# Patient Record
Sex: Male | Born: 1975 | Race: White | Hispanic: No | Marital: Married | Smoking: Former smoker
Health system: Southern US, Community
[De-identification: ages and names within clinical notes are randomized; demographics above are authoritative.]

## PROBLEM LIST (undated history)

## (undated) DIAGNOSIS — G35 Multiple sclerosis: Secondary | ICD-10-CM

## (undated) DIAGNOSIS — Z87891 Personal history of nicotine dependence: Secondary | ICD-10-CM

## (undated) DIAGNOSIS — G2581 Restless legs syndrome: Secondary | ICD-10-CM

## (undated) DIAGNOSIS — G43909 Migraine, unspecified, not intractable, without status migrainosus: Secondary | ICD-10-CM

## (undated) DIAGNOSIS — E291 Testicular hypofunction: Secondary | ICD-10-CM

## (undated) DIAGNOSIS — Z9109 Other allergy status, other than to drugs and biological substances: Secondary | ICD-10-CM

## (undated) DIAGNOSIS — G4733 Obstructive sleep apnea (adult) (pediatric): Secondary | ICD-10-CM

## (undated) DIAGNOSIS — M545 Low back pain: Secondary | ICD-10-CM

## (undated) DIAGNOSIS — Z1211 Encounter for screening for malignant neoplasm of colon: Secondary | ICD-10-CM

## (undated) DIAGNOSIS — Z9989 Dependence on other enabling machines and devices: Secondary | ICD-10-CM

## (undated) DIAGNOSIS — G44009 Cluster headache syndrome, unspecified, not intractable: Secondary | ICD-10-CM

## (undated) HISTORY — DX: Dependence on other enabling machines and devices: Z99.89

## (undated) HISTORY — DX: Personal history of nicotine dependence: Z87.891

## (undated) HISTORY — DX: Multiple sclerosis: G35

## (undated) HISTORY — DX: Migraine, unspecified, not intractable, without status migrainosus: G43.909

## (undated) HISTORY — DX: Obstructive sleep apnea (adult) (pediatric): G47.33

## (undated) HISTORY — DX: Cluster headache syndrome, unspecified, not intractable: G44.009

## (undated) HISTORY — DX: Encounter for screening for malignant neoplasm of colon: Z12.11

## (undated) HISTORY — DX: Low back pain: M54.5

## (undated) HISTORY — DX: Testicular hypofunction: E29.1

## (undated) HISTORY — DX: Restless legs syndrome: G25.81

---

## 2009-08-15 ENCOUNTER — Ambulatory Visit: Payer: Self-pay | Admitting: Internal Medicine

## 2009-08-15 DIAGNOSIS — M545 Low back pain, unspecified: Secondary | ICD-10-CM | POA: Insufficient documentation

## 2009-08-15 DIAGNOSIS — Z87891 Personal history of nicotine dependence: Secondary | ICD-10-CM | POA: Insufficient documentation

## 2009-08-15 HISTORY — DX: Low back pain, unspecified: M54.50

## 2009-08-15 HISTORY — DX: Personal history of nicotine dependence: Z87.891

## 2009-08-16 LAB — CONVERTED CEMR LAB
Albumin: 4.3 g/dL (ref 3.5–5.2)
Alkaline Phosphatase: 57 units/L (ref 39–117)
Basophils Relative: 0.8 % (ref 0.0–3.0)
CO2: 32 meq/L (ref 19–32)
Calcium: 9.4 mg/dL (ref 8.4–10.5)
Chloride: 104 meq/L (ref 96–112)
Eosinophils Absolute: 0.1 10*3/uL (ref 0.0–0.7)
HCT: 48.1 % (ref 39.0–52.0)
HDL: 44.6 mg/dL (ref >39.00)
Hemoglobin: 16.6 g/dL (ref 13.0–17.0)
Leukocytes, UA: NEGATIVE
Lymphocytes Relative: 25.6 % (ref 12.0–46.0)
Lymphs Abs: 1.7 10*3/uL (ref 0.7–4.0)
MCHC: 34.6 g/dL (ref 30.0–36.0)
MCV: 90.8 fL (ref 78.0–100.0)
Neutro Abs: 4 10*3/uL (ref 1.4–7.7)
Nitrite: NEGATIVE
Potassium: 4.4 meq/L (ref 3.5–5.1)
RBC: 5.3 M/uL (ref 4.22–5.81)
Sodium: 136 meq/L (ref 135–145)
Specific Gravity, Urine: 1.025 (ref 1.000–1.030)
TSH: 0.49 microintl units/mL (ref 0.35–5.50)
Total Protein: 7.4 g/dL (ref 6.0–8.3)
pH: 5.5 (ref 5.0–8.0)

## 2009-12-30 ENCOUNTER — Ambulatory Visit: Payer: Self-pay | Admitting: Internal Medicine

## 2009-12-30 DIAGNOSIS — J019 Acute sinusitis, unspecified: Secondary | ICD-10-CM | POA: Insufficient documentation

## 2010-01-20 ENCOUNTER — Telehealth: Payer: Self-pay | Admitting: Internal Medicine

## 2010-01-24 ENCOUNTER — Ambulatory Visit: Payer: Self-pay | Admitting: Internal Medicine

## 2010-02-27 ENCOUNTER — Ambulatory Visit: Payer: Self-pay | Admitting: Internal Medicine

## 2010-02-27 DIAGNOSIS — R519 Headache, unspecified: Secondary | ICD-10-CM | POA: Insufficient documentation

## 2010-02-27 DIAGNOSIS — R51 Headache: Secondary | ICD-10-CM | POA: Insufficient documentation

## 2010-02-27 DIAGNOSIS — G44009 Cluster headache syndrome, unspecified, not intractable: Secondary | ICD-10-CM

## 2010-02-27 HISTORY — DX: Cluster headache syndrome, unspecified, not intractable: G44.009

## 2010-10-21 ENCOUNTER — Ambulatory Visit: Payer: Self-pay | Admitting: Internal Medicine

## 2010-10-21 DIAGNOSIS — J069 Acute upper respiratory infection, unspecified: Secondary | ICD-10-CM | POA: Insufficient documentation

## 2010-12-09 NOTE — Assessment & Plan Note (Signed)
Summary: DR AVP PT--NOT IN CLINIC THIS PM  HEADACHES-FEVER-SDR---STC   Vital Signs:  Patient profile:   35 year old male Height:      74 inches Weight:      201.25 pounds BMI:     25.93 O2 Sat:      97 % on Room air Temp:     98.3 degrees F oral Pulse rate:   90 / minute BP sitting:   108 / 62  (left arm) Cuff size:   regular  Vitals Entered ByZella Ball Ewing (February 27, 2010 4:10 PM)  O2 Flow:  Room air CC: Headaches/RE   Primary Care Provider:  Tresa Garter MD  CC:  Headaches/RE.  History of Present Illness: pt of dr plotnikov - had recent sinus/uri infection that seemed to resolve, but here with 4 wks headaches that seem different  -  located to crown, sides and back of the head, and behind the eyes;  no fever , congestion this time except for allergies;  no ST, cough.  Pt denies CP, sob, doe, wheezing, orthopnea, pnd, worsening LE edema, palps, dizziness or syncope  Pt denies new neuro symptoms such as facial or extremity weakness , or blurred vision.  Tried tylenol last night - helped, but concerned b/c seems to be more freq the last  whole wk though still mild to mod and intermittent;  also tried etodolac this AM that might have helped some, but pain at least still mild to mod.  No dysequilibrium, Geisler or gait problem., trauma.  Although has not had significant depressive symtpoms, has had signfiicant stress and anxious -  works for [employer]  - [place]" [place] year in the spring as it is known there.  No scalp itch or lesions.  No prior hx of similar HA in the past, or other such as migraine, but has had ongoing teeth grinding as well, also worse in the past wk.    Problems Prior to Update: 1)  Headache  (ICD-784.0) 2)  Sinusitis- Acute-nos  (ICD-461.9) 3)  Physical Examination  (ICD-V70.0) 4)  Low Back Pain  (ICD-724.2) 5)  Tobacco Use, Quit  (ICD-V15.82)  Medications Prior to Update: 1)  Hydrocodone-Acetaminophen 5-325 Mg Tabs  (Hydrocodone-Acetaminophen) .Marland Kitchen.. 1 By Mouth Up To 4 Times Per Day As Needed For Pain 2)  Vitamin D3 1000 Unit  Tabs (Cholecalciferol) .Marland Kitchen.. 1 By Mouth Daily  Current Medications (verified): 1)  Nabumetone 750 Mg Tabs (Nabumetone) .Marland Kitchen.. 1 By Mouth Two Times A Day As Needed Pain 2)  Vitamin D3 1000 Unit  Tabs (Cholecalciferol) .Marland Kitchen.. 1 By Mouth Daily 3)  Imipramine Hcl 50 Mg Tabs (Imipramine Hcl) .Marland Kitchen.. 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   Head:  normocephalic and atraumatic.   Eyes:  vision grossly intact, pupils equal, and pupils round.   Ears:  R ear normal and L ear normal.   Nose:  no external deformity and no nasal discharge.  Mouth:  no gingival abnormalities and pharynx pink and moist.  , mult teeth with grinding down abnormalities Neck:  supple and no masses.   Lungs:  normal respiratory effort and normal breath sounds.   Heart:  normal rate and regular rhythm.   Msk:  head NCAT; no c-spine tender  Extremities:  no edema, no erythema  Neurologic:  alert & oriented X3, cranial nerves II-XII intact, strength normal in all extremities, sensation intact to light touch, gait normal, and DTRs symmetrical and normal.   Skin:  color normal and no scalp rashes.   Psych:  moderately anxious.     Impression & Recommendations:  Problem # 1:  HEADACHE (ICD-784.0)  His updated medication list for this problem includes:    Nabumetone  750 Mg Tabs (Nabumetone) .Marland Kitchen... 1 by mouth two times a day as needed pain c/w tension HA most likely ;  likely increased due to work stress like the teeth grinding as well;  for imipramine at bedtime, and relafen two times a day as needed ; f/y primary MD for any worsening symtpoms;  I do not feel he needs MRI at this time or neuro eval  Complete Medication List: 1)  Nabumetone 750 Mg Tabs (Nabumetone) .Marland Kitchen.. 1 by mouth two times a day as needed pain 2)  Vitamin D3 1000 Unit Tabs (Cholecalciferol) .Marland Kitchen.. 1 by mouth daily 3)  Imipramine Hcl 50 Mg Tabs (Imipramine hcl) .Marland Kitchen.. 1po at bedtime  Patient Instructions: 1)  Please take all new medications as prescribed 2)  Continue all previous medications as before this visit  3)  Please schedule an appointment with your primary doctor for any worsening symptoms  Prescriptions: IMIPRAMINE HCL 50 MG TABS (IMIPRAMINE HCL) 1po at bedtime  #30 x 5   Entered and Authorized by:   Corwin Levins MD   Signed by:   Corwin Levins MD on 02/27/2010   Method used:   Print then Give to Patient   RxID:   [id number] NABUMETONE 750 MG TABS (NABUMETONE) 1 by mouth two times a day as needed pain  #60 x 2   Entered and Authorized by:   Corwin Levins MD   Signed by:   Corwin Levins MD on 02/27/2010   Method used:   Print then Give to Patient   RxID:   [id number]

## 2010-12-09 NOTE — Assessment & Plan Note (Signed)
Summary: continued sinus complaints/ SD   Vital Signs:  Patient profile:   35 year old male Weight:      206 pounds O2 Sat:      97 % Temp:     97 degrees F oral Pulse rate:   79 / minute BP sitting:   128 / 82  (left arm)  Vitals Entered By: Tora Perches (January 24, 2010 9:41 AM) CC: ongoing sinus problems Is Patient Diabetic? No   Primary Care Provider:  Tresa Garter MD  CC:  ongoing sinus problems.  History of Present Illness: C/o HA around R eye and yellow nasal d/c  Preventive Screening-Counseling & Management  Alcohol-Tobacco     Smoking Status: quit  Current Medications (verified): 1)  Hydrocodone-Acetaminophen 5-325 Mg Tabs (Hydrocodone-Acetaminophen) .Marland Kitchen.. 1 By Mouth Up To 4 Times Per Day As Needed For Pain 2)  Vitamin D3 1000 Unit  Tabs (Cholecalciferol) .Marland Kitchen.. 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    Physical Exam  General:  alert, well-developed, well-nourished, well-hydrated, appropriate dress, normal appearance, healthy-appearing, cooperative to examination, and good hygiene.   Nose:  Erythematous throat mucosa and intranasal erythema.  Lungs:  Normal respiratory effort, chest expands symmetrically. Lungs are clear to auscultation, no crackles or wheezes. Heart:  Normal rate and regular rhythm. S1 and S2 normal without gallop, murmur, click, rub or other extra sounds. Msk:  No deformity or scoliosis noted of thoracic or lumbar spine.   Skin:  Intact without suspicious lesions or rashes   Impression & Recommendations:  Problem # 1:  SINUSITIS- ACUTE-NOS (ICD-461.9) Assessment Deteriorated  His updated medication list for this problem  includes:    Augmentin 875-125 Mg Tabs (Amoxicillin-pot clavulanate) .Marland Kitchen... 1 by mouth bid  Complete Medication List: 1)  Hydrocodone-acetaminophen 5-325 Mg Tabs (Hydrocodone-acetaminophen) .Marland Kitchen.. 1 by mouth up to 4 times per day as needed for pain 2)  Vitamin D3 1000 Unit Tabs (Cholecalciferol) .Marland Kitchen.. 1 by mouth daily 3)  Augmentin 875-125 Mg Tabs (Amoxicillin-pot clavulanate) .Marland Kitchen.. 1 by mouth bid  Patient Instructions: 1)  Use the Sinus rinse as needed 2)  Call if you are not better in a reasonable amount of time or if worse.  Prescriptions: AUGMENTIN 875-125 MG TABS (AMOXICILLIN-POT CLAVULANATE) 1 by mouth bid  #20 x 1   Entered and Authorized by:   Tresa Garter MD   Signed by:   Tresa Garter MD on 01/24/2010   Method used:   Electron[facility] to        [facility]  [phone]* (retail)       [address] [place], [place]  [address]       Ph: [phone] or [phone]       Fax: [phone]   RxID:   [id number]

## 2010-12-09 NOTE — Progress Notes (Signed)
Summary: REFERRAL?   Phone Note Call from Patient Call back at [phone] Wife   Summary of Call: Pt completed rx for antibiotic. He continues to c/o sinus pressure, h/a and otc meds have given no relief. Should pt come back in for office visit? or referral to ENT?  Initial call taken by: Lamar Sprinkles, CMA,  January 20, 2010 1:53 PM  Follow-up for Phone Call        suggest re-eval OV by TJ or AVP before ENT as pt may need CT scan or other workup and tx prior to ENT -  Follow-up by: Newt Lukes MD,  January 20, 2010 2:16 PM  Additional Follow-up for Phone Call Additional follow up Details #1::        Scheduled for office visit  Additional Follow-up by: Lamar Sprinkles, CMA,  January 20, 2010 2:51 PM

## 2010-12-09 NOTE — Miscellaneous (Signed)
Summary: [facility]   Imported By: Lester [place] 01/31/2010 10:04:52  _____________________________________________________________________  External Attachment:    Type:   Image     Comment:   External Document

## 2010-12-09 NOTE — Assessment & Plan Note (Signed)
Summary: ?sinus infection/plot/cd   Vital Signs:  Patient profile:   35 year old male Height:      74 inches Weight:      201 pounds BMI:     25.90 O2 Sat:      98 % on Room air Temp:     98.0 degrees F oral Pulse rate:   64 / minute Pulse rhythm:   regular Resp:     16 per minute BP sitting:   112 / 80  (left arm) Cuff size:   large  Vitals Entered By: Rock Nephew CMA (December 30, 2009 2:21 PM)  Nutrition Counseling: Patient's BMI is greater than 25 and therefore counseled on weight management options.  O2 Flow:  Room air CC: sinus congestion/pressure and headache, URI symptoms Is Patient Diabetic? No   Primary Care Provider:  Tresa Garter MD  CC:  sinus congestion/pressure and headache and URI symptoms.  History of Present Illness:  URI Symptoms      This is a 35 year old man who presents with URI symptoms.  The symptoms began 1 week ago.  The severity is described as mild.  The patient reports nasal congestion, purulent nasal discharge, sore throat, and dry cough, but denies productive cough, earache, and sick contacts.  Associated symptoms include low-grade fever (<100.5 degrees).  The patient denies stiff neck, dyspnea, wheezing, rash, vomiting, diarrhea, and use of an antipyretic.  The patient denies headache, muscle aches, and severe fatigue.  Risk factors for Strep sinusitis include unilateral facial pain, unilateral nasal discharge, poor response to decongestant, and double sickening.  The patient denies the following risk factors for Strep sinusitis: tooth pain, tender adenopathy, and absence of cough.    Preventive Screening-Counseling & Management  Alcohol-Tobacco     Alcohol drinks/day: 0     Smoking Status: quit  Hep-HIV-STD-Contraception     Hepatitis Risk: no risk noted     HIV Risk: no risk noted     STD Risk: no risk noted      Sexual History:  currently monogamous.        Drug Use:  never.        Blood Transfusions:  no.    Medications  Prior to Update: 1)  Hydrocodone-Acetaminophen 5-325 Mg Tabs (Hydrocodone-Acetaminophen) .Marland Kitchen.. 1 By Mouth Up To 4 Times Per Day As Needed For Pain 2)  Vitamin D3 1000 Unit  Tabs (Cholecalciferol) .Marland Kitchen.. 1 By Mouth Daily  Current Medications (verified): 1)  Hydrocodone-Acetaminophen 5-325 Mg Tabs (Hydrocodone-Acetaminophen) .Marland Kitchen.. 1 By Mouth Up To 4 Times Per Day As Needed For Pain 2)  Vitamin D3 1000 Unit  Tabs (Cholecalciferol) .Marland Kitchen.. 1 By Mouth Daily 3)  Amoxicillin 500 Mg Cap (Amoxicillin) .... Take 1 Capsule By Mouth Three Times A Day X 10 Days  Allergies (verified): No Known Drug Allergies  Past History:  Past Medical History: Reviewed history from 08/15/2009 and no changes required. Low back pain  Past Surgical History: Reviewed history from 08/15/2009 and no changes required. Denies surgical history  Family History: Reviewed history from 08/15/2009 and no changes required. F HTN, chol  Social History: Reviewed history from 08/15/2009 and no changes required. Occupation: Therapist, occupational Former Smoker 2010 Alcohol use-yes Regular exercise-yes Married Hepatitis Risk:  no risk noted HIV Risk:  no risk noted STD Risk:  no risk noted Sexual History:  currently monogamous Drug Use:  never Blood Transfusions:  no  Review of Systems  The patient denies anorexia, fever, weight loss, vision  loss, decreased hearing, hoarseness, chest pain, peripheral edema, prolonged cough, headaches, hemoptysis, abdominal pain, suspicious skin lesions, enlarged lymph nodes, and angioedema.    Physical Exam  General:  alert, well-developed, well-nourished, well-hydrated, appropriate dress, normal appearance, healthy-appearing, cooperative to examination, and good hygiene.   Head:  normocephalic and atraumatic.   Eyes:  vision grossly intact, pupils equal, pupils round, and pupils reactive to light.   Ears:  R ear normal and L ear normal.   Nose:  no airflow obstruction, no intranasal foreign  body, no nasal polyps, no nasal mucosal lesions, no mucosal friability, no active bleeding or clots, no septum abnormalities, nasal discharge, mucosal erythema, and mucosal edema.  no airflow obstruction, no intranasal foreign body, no nasal polyps, no nasal mucosal lesions, no mucosal friability, no active bleeding or clots, no septum abnormalities, nasal dischargemucosal pallor, mucosal erythema, mucosal edema, L maxillary sinus tenderness, and R maxillary sinus tenderness.   Mouth:  good dentition, no exudates, no posterior lymphoid hypertrophy, no postnasal drip, no pharyngeal crowing, no lesions, no aphthous ulcers, no erosions, no tongue abnormalities, no leukoplakia, no petechiae, and pharyngeal erythema.   Neck:  No deformities, masses, or tenderness noted. Lungs:  Normal respiratory effort, chest expands symmetrically. Lungs are clear to auscultation, no crackles or wheezes. Heart:  Normal rate and regular rhythm. S1 and S2 normal without gallop, murmur, click, rub or other extra sounds. Abdomen:  Bowel sounds positive,abdomen soft and non-tender without masses, organomegaly or hernias noted. Msk:  No deformity or scoliosis noted of thoracic or lumbar spine.   Pulses:  R and L carotid,radial,femoral,dorsalis pedis and posterior tibial pulses are full and equal bilaterally Extremities:  No clubbing, cyanosis, edema, or deformity noted with normal full range of motion of all joints.   Neurologic:  No cranial nerve deficits noted. Station and gait are normal. Plantar reflexes are down-going bilaterally. DTRs are symmetrical throughout. Sensory, motor and coordinative functions appear intact. Skin:  Intact without suspicious lesions or rashes Cervical Nodes:  no anterior cervical adenopathy and no posterior cervical adenopathy.   Axillary Nodes:  no R axillary adenopathy and no L axillary adenopathy.   Psych:  Cognition and judgment appear intact. Alert and cooperative with normal attention span  and concentration. No apparent delusions, illusions, hallucinations   Impression & Recommendations:  Problem # 1:  SINUSITIS- ACUTE-NOS (ICD-461.9) Assessment New  His updated medication list for this problem includes:    Amoxicillin 500 Mg Cap (Amoxicillin) .Marland Kitchen... Take 1 capsule by mouth three times a day x 10 days  Instructed on treatment. Call if symptoms persist or worsen.   Complete Medication List: 1)  Hydrocodone-acetaminophen 5-325 Mg Tabs (Hydrocodone-acetaminophen) .Marland Kitchen.. 1 by mouth up to 4 times per day as needed for pain 2)  Vitamin D3 1000 Unit Tabs (Cholecalciferol) .Marland Kitchen.. 1 by mouth daily 3)  Amoxicillin 500 Mg Cap (Amoxicillin) .... Take 1 capsule by mouth three times a day x 10 days  Patient Instructions: 1)  Please schedule a follow-up appointment in 1 month. 2)  Take your antibiotic as prescribed until ALL of it is gone, but stop if you develop a rash or swelling and contact our office as soon as possible. 3)  Acute sinusitis symptoms for less than 10 days are not helped by antibiotics.Use warm moist compresses, and over the counter decongestants ( only as directed). Call if no improvement in 5-7 days, sooner if increasing pain, fever, or new symptoms. Prescriptions: AMOXICILLIN 500 MG CAP (AMOXICILLIN) Take 1 capsule by mouth  three times a day X 10 days  #30 x 0   Entered and Authorized by:   Etta Grandchild MD   Signed by:   Etta Grandchild MD on 12/30/2009   Method used:   Elect[facility] to        [facility]  [phone]* (retail)       [address] [place], [place]  [address]       Ph: [phone] or [phone]       Fax: [phone]   RxID:   [id number]

## 2010-12-11 NOTE — Assessment & Plan Note (Signed)
Summary: ?sinus/cant talk/lb   Vital Signs:  Patient profile:   35 year old male Height:      74 inches Weight:      200 pounds BMI:     25.77 O2 Sat:      97 % on Room air Temp:     97.8 degrees F oral Pulse rate:   83 / minute Pulse rhythm:   regular BP sitting:   120 / 74  (left arm) Cuff size:   large  Vitals Entered By: Rock Nephew CMA (October 21, 2010 2:44 PM)  O2 Flow:  Room air CC: Patient c/o sinus congestion, pressure and pain x1wk Is Patient Diabetic? No  Does patient need assistance? Functional Status Self care Ambulation Normal   Primary Care Provider:  Georgina Quint Plotnikov MD  CC:  Patient c/o sinus congestion and pressure and pain x1wk.  History of Present Illness: The patient presents with complaints of sore throat, fever, cough, sinus congestion and drainge of several days duration. Not better with OTC meds..  The mucus is colored.   Allergies: No Known Drug Allergies  Review of Systems       The patient complains of fever.  The patient denies dyspnea on exertion.    Physical Exam  General:  alert and well-developed.   Mouth:  Erythematous throat and intranasal mucosa c/w URI  Lungs:  normal respiratory effort and normal breath sounds.   Heart:  normal rate and regular rhythm.     Impression & Recommendations:  Problem # 1:  SINUSITIS- ACUTE-NOS (ICD-461.9)/ URI Assessment New  His updated medication list for this problem includes:    Zithromax Z-pak 250 Mg Tabs (Azithromycin) .Marland Kitchen... As dirrected    Promethazine-codeine 6.25-10 Mg/83ml Syrp (Promethazine-codeine) .Marland Kitchen... 5-10 ml by mouth q id as needed cough  Complete Medication List: 1)  Vitamin D3 1000 Unit Tabs (Cholecalciferol) .Marland Kitchen.. 1 by mouth daily 2)  Zithromax Z-pak 250 Mg Tabs (Azithromycin) .... As dirrected 3)  Promethazine-codeine 6.25-10 Mg/95ml Syrp (Promethazine-codeine) .... 5-10 ml by mouth q id as needed cough  Patient Instructions: 1)  Call if you are not better in a  reasonable amount of time or if worse.   Prescriptions: PROMETHAZINE-CODEINE 6.25-10 MG/5ML SYRP (PROMETHAZINE-CODEINE) 5-10 ml by mouth q id as needed cough  #300 ml x 0   Entered and Authorized by:   Tresa Garter MD   Signed by:   Tresa Garter MD on 10/21/2010   Method used:   Print then Give to Patient   RxID:   [id number] JYNWGNFAO Z-PAK 250 MG TABS (AZITHROMYCIN) as dirrected  #1 x 0   Entered and Authorized by:   Tresa Garter MD   Signed by:   Tresa Garter MD on 10/21/2010   Method used:   [facility]ron[facility] to        [facility]  [phone]* (retail)       [address] [place], [place]  [address]       Ph: [phone] or [phone]       Fax: [phone]   RxID:   [id number]    Orders Added: 1)  Est. Patient Level III [51884]

## 2011-06-16 ENCOUNTER — Telehealth: Payer: Self-pay | Admitting: *Deleted

## 2011-06-16 NOTE — Telephone Encounter (Signed)
Patient requesting referral to ENT for recurrent sinus infections

## 2011-06-16 NOTE — Telephone Encounter (Signed)
Pls sch OV Thx 

## 2011-06-17 ENCOUNTER — Encounter: Payer: Self-pay | Admitting: Internal Medicine

## 2011-06-17 ENCOUNTER — Ambulatory Visit (INDEPENDENT_AMBULATORY_CARE_PROVIDER_SITE_OTHER): Payer: BC Managed Care – PPO | Admitting: Internal Medicine

## 2011-06-17 VITALS — BP 100/60 | HR 70 | Temp 98.3°F | Ht 74.0 in | Wt 205.4 lb

## 2011-06-17 DIAGNOSIS — J019 Acute sinusitis, unspecified: Secondary | ICD-10-CM

## 2011-06-17 MED ORDER — LEVOFLOXACIN 500 MG PO TABS: 500.0000 mg | ORAL_TABLET | Freq: Every day | ORAL | Status: AC

## 2011-06-17 NOTE — Assessment & Plan Note (Signed)
Mild to mod, for antibx course,  to f/u any worsening symptoms or concerns, also for CT sinus and ENT eval due to recurrent symtpoms

## 2011-06-17 NOTE — Progress Notes (Signed)
  Subjective:    Patient ID: Dylan Pope, male    DOB: 06-05-1976, 35 y.o.   MRN: [id number]  HPI  Here with 3 days acute onset fever, facial pain, pressure, general weakness and malaise, and greenish d/c, with slight ST, but little to no cough and Pt denies chest pain, increased sob or doe, wheezing, orthopnea, PND, increased LE swelling, palpitations, dizziness or syncope.  Seems to occur at least 2-3 times per yr. No sick contacts Past Medical History  Diagnosis Date  . Headache 02/27/2010  . LOW BACK PAIN 08/15/2009  . SINUSITIS- ACUTE-NOS 12/30/2009  . TOBACCO USE, QUIT 08/15/2009  . UPPER RESPIRATORY INFECTION, ACUTE 10/21/2010   No past surgical history on file.  reports that he has quit smoking. He does not have any smokeless tobacco history on file. He reports that he drinks alcohol. His drug history not on file. family history includes Hyperlipidemia in his other and Hypertension in his other. No Known Allergies Current Outpatient Prescriptions on File Prior to Visit  Medication Sig Dispense Refill  . Cholecalciferol (VITAMIN D3) 1000 UNITS CAPS Take by mouth daily.        . promethazine-codeine (PHENERGAN WITH CODEINE) 6.25-10 MG/5ML syrup Take 5 mLs by mouth every 4 (four) hours as needed.          Review of Systems Review of Systems  Constitutional: Negative for diaphoresis and unexpected weight change.  HENT: Negative for drooling and tinnitus.   Eyes: Negative for photophobia and visual disturbance.  Respiratory: Negative for choking and stridor.        Objective:   Physical Exam BP 100/60  Pulse 70  Temp(Src) 98.3 F (36.8 C) (Oral)  Ht 6\' 2"  (1.88 m)  Wt 205 lb 6 oz (93.157 kg)  BMI 26.37 kg/m2  SpO2 98% Physical Exam  VS noted Constitutional: Pt appears well-developed and well-nourished.  HENT: Head: Normocephalic.  Right Ear: External ear normal.  Left Ear: External ear normal.  Bilat tm's mild erythema.  Sinus tender with swelling left maxillary > right  maxillary.  Pharynx mild erythema Eyes: Conjunctivae and EOM are normal. Pupils are equal, round, and reactive to light.  Neck: Normal range of motion. Neck supple.  Cardiovascular: Normal rate and regular rhythm.   Pulmonary/Chest: Effort normal and breath sounds normal.  Neurological: Pt is alert. No cranial nerve deficit.  Skin: Skin is warm. No erythema.  Psychiatric: Pt behavior is normal. Thought content normal.         Assessment & Plan:

## 2011-06-17 NOTE — Patient Instructions (Signed)
Take all new medications as prescribed - antibiotic sent to the pharmacy Continue all other medications as before You will be contacted regarding the referral for: CT scan, and ENT You can also take Delsym OTC for cough, and/or Mucinex (or it's generic off brand) for congestion You can also take Allegra OTC if you think you may in part allergies as well

## 2011-06-17 NOTE — Telephone Encounter (Signed)
Scheduled for OV today.

## 2011-07-01 ENCOUNTER — Other Ambulatory Visit: Payer: Self-pay | Admitting: Internal Medicine

## 2011-07-01 ENCOUNTER — Ambulatory Visit (INDEPENDENT_AMBULATORY_CARE_PROVIDER_SITE_OTHER)
Admission: RE | Admit: 2011-07-01 | Discharge: 2011-07-01 | Disposition: A | Discharge status: Home / Self Care | Payer: BC Managed Care – PPO | Source: Ambulatory Visit | Attending: Internal Medicine | Admitting: Internal Medicine

## 2011-07-01 DIAGNOSIS — J019 Acute sinusitis, unspecified: Secondary | ICD-10-CM

## 2011-10-11 ENCOUNTER — Encounter (HOSPITAL_COMMUNITY): Payer: Self-pay | Admitting: *Deleted

## 2011-10-11 ENCOUNTER — Emergency Department (HOSPITAL_COMMUNITY)
Admission: EM | Admit: 2011-10-11 | Discharge: 2011-10-11 | Disposition: A | Discharge status: Home / Self Care | Payer: BC Managed Care – PPO | Source: Home / Self Care

## 2011-10-11 DIAGNOSIS — J209 Acute bronchitis, unspecified: Secondary | ICD-10-CM

## 2011-10-11 HISTORY — DX: Other allergy status, other than to drugs and biological substances: Z91.09

## 2011-10-11 MED ORDER — BENZONATATE 100 MG PO CAPS: 100.0000 mg | ORAL_CAPSULE | Freq: Three times a day (TID) | ORAL | Status: AC

## 2011-10-11 NOTE — ED Provider Notes (Signed)
History     CSN: [id number] Arrival date & time: 10/11/2011  9:30 AM   None     Chief Complaint  Patient presents with  . Cough  . Generalized Body Aches    (Consider location/radiation/quality/duration/timing/severity/associated sxs/prior treatment) HPI Comments: Onset of cough 2-3 days ago. Cough was nonproductive but has become more congested. Has not actually coughed up phlegm. Low grade fever and body aches began yesterday. Denies nasal congestion, sinus pressure or post nasal drainage. Has been taking otc cough medications. Cough is not disruptive to sleep.   Patient is a 35 y.o. male presenting with cough. The history is provided by the patient.  Cough This is a new problem. The current episode started more than 2 days ago. The problem has been gradually worsening. The cough is productive of sputum. Maximum temperature: 99-100. Associated symptoms include myalgias. Pertinent negatives include no chest pain, no chills, no ear pain, no headaches, no rhinorrhea, no sore throat, no shortness of breath and no wheezing. He has tried cough syrup for the symptoms. The treatment provided moderate relief. His past medical history does not include asthma.    Past Medical History  Diagnosis Date  . Headache 02/27/2010  . LOW BACK PAIN 08/15/2009  . SINUSITIS- ACUTE-NOS 12/30/2009  . TOBACCO USE, QUIT 08/15/2009  . UPPER RESPIRATORY INFECTION, ACUTE 10/21/2010  . Environmental allergies     History reviewed. No pertinent past surgical history.  Family History  Problem Relation Age of Onset  . Hypertension Other   . Hyperlipidemia Other     History  Substance Use Topics  . Smoking status: Former Games developer  . Smokeless tobacco: Not on file  . Alcohol Use: Yes     occasional use      Review of Systems  Constitutional: Negative for fever, chills and fatigue.  HENT: Negative for ear pain, congestion, sore throat, rhinorrhea, sneezing, postnasal drip and sinus pressure.     Respiratory: Positive for cough. Negative for shortness of breath and wheezing.   Cardiovascular: Negative for chest pain and palpitations.  Musculoskeletal: Positive for myalgias.  Neurological: Negative for headaches.    Allergies  Review of patient's allergies indicates no known allergies.  Home Medications   Current Outpatient Rx  Name Route Sig Dispense Refill  . ALLEGRA PO Oral Take by mouth daily.      Marland Kitchen UNKNOWN TO PATIENT  OTC allergy med     . BENZONATATE 100 MG PO CAPS Oral Take 1 capsule (100 mg total) by mouth every 8 (eight) hours. 1-2 caps every 8 hrs prn cough 30 capsule 0    BP 119/80  Pulse 87  Temp(Src) 98.7 F (37.1 C) (Oral)  Resp 20  SpO2 97%  Physical Exam  Nursing note and vitals reviewed. Constitutional: He appears well-developed and well-nourished. No distress.  HENT:  Head: Normocephalic and atraumatic.  Right Ear: Tympanic membrane, external ear and ear canal normal.  Left Ear: Tympanic membrane, external ear and ear canal normal.  Nose: Nose normal.  Mouth/Throat: Uvula is midline, oropharynx is clear and moist and mucous membranes are normal. No oropharyngeal exudate, posterior oropharyngeal edema or posterior oropharyngeal erythema.  Neck: Neck supple.  Cardiovascular: Normal rate, regular rhythm and normal heart sounds.   Pulmonary/Chest: Effort normal and breath sounds normal. No respiratory distress.  Lymphadenopathy:    He has no cervical adenopathy.  Neurological: He is alert.  Skin: Skin is warm and dry.  Psychiatric: He has a normal mood and affect.  ED Course  Procedures (including critical care time)  Labs Reviewed - No data to display No results found.   1. Acute bronchitis       MDM          Melody Comas, PA 10/11/11 1157

## 2011-10-11 NOTE — ED Notes (Signed)
C/O cough starting Fri w/ progressive worsening - now productive.  Yesterday started w/ body aches and temps around 100.  Has been taking Dayquil and Nyquil.  BBS clear.

## 2011-10-16 NOTE — ED Provider Notes (Signed)
Medical screening examination/treatment/procedure(s) were performed by non-physician practitioner and as supervising physician I was immediately available for consultation/collaboration.  Luiz Blare MD   Luiz Blare, MD 10/16/11 [phone]

## 2011-11-04 ENCOUNTER — Encounter: Payer: Self-pay | Admitting: Internal Medicine

## 2011-11-04 ENCOUNTER — Ambulatory Visit (INDEPENDENT_AMBULATORY_CARE_PROVIDER_SITE_OTHER): Payer: BC Managed Care – PPO | Admitting: Internal Medicine

## 2011-11-04 VITALS — BP 120/80 | HR 84 | Temp 98.6°F | Resp 16 | Wt 205.0 lb

## 2011-11-04 DIAGNOSIS — J019 Acute sinusitis, unspecified: Secondary | ICD-10-CM

## 2011-11-04 DIAGNOSIS — J310 Chronic rhinitis: Secondary | ICD-10-CM | POA: Insufficient documentation

## 2011-11-04 MED ORDER — AMOXICILLIN 500 MG PO CAPS: 1000.0000 mg | ORAL_CAPSULE | Freq: Two times a day (BID) | ORAL | Status: AC

## 2011-11-04 MED ORDER — AZELASTINE-FLUTICASONE 137-50 MCG/ACT NA SUSP: 1.0000 | NASAL | Status: DC

## 2011-11-04 MED ORDER — METHYLPREDNISOLONE ACETATE PF 80 MG/ML IJ SUSP
120.0000 mg | Freq: Once | INTRAMUSCULAR | Status: AC
  Administered 2011-11-04: 120 mg via INTRAMUSCULAR

## 2011-11-04 NOTE — Patient Instructions (Signed)
Use over-the-counter  "cold" medicines  such as "Tylenol cold" , "Advil cold",  "Mucinex" or" Mucinex D"  for cough and congestion.   Avoid decongestants if you have high blood pressure and use "Afrin" nasal spray for nasal congestion as directed instead. Use" Delsym" or" Robitussin" cough syrup varietis for cough.  You can use plain "Tylenol" or "Advil" for fever, chills and achyness.  

## 2011-11-04 NOTE — Assessment & Plan Note (Signed)
Amoxicillin

## 2011-11-04 NOTE — Progress Notes (Signed)
  Subjective:    Patient ID: Dylan Pope, male    DOB: 25-Nov-1975, 35 y.o.   MRN: [id number]  HPI  C/o severe sinus congestion - yellow d/c x 5 d; HA    Review of Systems  HENT: Positive for nosebleeds, congestion, sore throat, rhinorrhea, postnasal drip and sinus pressure.   Respiratory: Positive for cough.        Objective:   Physical Exam  Constitutional: He appears well-developed and well-nourished. No distress.  HENT:  Mouth/Throat: No oropharyngeal exudate.       Swollen nasal mucsa Bloody white d/c on R  Eryth throat  Pulmonary/Chest: He has no wheezes. He has no rales.  Skin: No rash noted. He is not diaphoretic.  Psychiatric: Judgment normal.          Assessment & Plan:

## 2011-11-04 NOTE — Assessment & Plan Note (Signed)
Dymista qd

## 2012-05-05 ENCOUNTER — Ambulatory Visit: Payer: BC Managed Care – PPO | Admitting: Internal Medicine

## 2012-10-24 ENCOUNTER — Encounter: Payer: Self-pay | Admitting: Family Medicine

## 2012-10-24 ENCOUNTER — Ambulatory Visit (INDEPENDENT_AMBULATORY_CARE_PROVIDER_SITE_OTHER): Payer: BC Managed Care – PPO | Admitting: Family Medicine

## 2012-10-24 VITALS — BP 117/78 | HR 73 | Temp 97.8°F | Ht 74.0 in | Wt 214.4 lb

## 2012-10-24 DIAGNOSIS — J019 Acute sinusitis, unspecified: Secondary | ICD-10-CM

## 2012-10-24 DIAGNOSIS — T7840XA Allergy, unspecified, initial encounter: Secondary | ICD-10-CM

## 2012-10-24 DIAGNOSIS — J329 Chronic sinusitis, unspecified: Secondary | ICD-10-CM

## 2012-10-24 MED ORDER — AMOXICILLIN-POT CLAVULANATE 875-125 MG PO TABS: 1.0000 | ORAL_TABLET | Freq: Two times a day (BID) | ORAL | Status: DC

## 2012-10-24 MED ORDER — GUAIFENESIN ER 600 MG PO TB12: 600.0000 mg | ORAL_TABLET | Freq: Two times a day (BID) | ORAL | Status: DC

## 2012-10-24 MED ORDER — AZELASTINE-FLUTICASONE 137-50 MCG/ACT NA SUSP: 1.0000 | NASAL | Status: DC

## 2012-10-24 NOTE — Progress Notes (Signed)
Patient ID: Dylan Pope, male   DOB: 12-09-1975, 36 y.o.   MRN: [id number] Dylan Pope [id number] 1975/12/27 10/24/2012      Progress Note-Follow Up  Subjective  Chief Complaint  Chief Complaint  Patient presents with  . Sinusitis    sinus headache X 1 week, sore throat X 3 days, eyes watery    HPI  Patient is a 36 year old caucasian male in today for a one-week history of worsening respiratory symptoms. He said last week with fevers, chills, malaise myalgias and congestion. He had some multi-clear but occasionally yellow and red tinged rhinorrhea. Over the last few days she's developed increasing headache and pain and pressure behind his right eye as well as facial pressure. Had a mild sore throat as well. Watery itchy eyes were also noted. No significant ear symptoms. No significant chest pain, palpitations, shortness of breath, GI or GU complaints noted here  Past Medical History  Diagnosis Date  . Headache 02/27/2010  . LOW BACK PAIN 08/15/2009  . SINUSITIS- ACUTE-NOS 12/30/2009  . TOBACCO USE, QUIT 08/15/2009  . UPPER RESPIRATORY INFECTION, ACUTE 10/21/2010  . Environmental allergies     History reviewed. No pertinent past surgical history.  Family History  Problem Relation Age of Onset  . Hypertension Other   . Hyperlipidemia Other     History   Social History  . Marital Status: Married    Spouse Name: N/A    Number of Children: N/A  . Years of Education: N/A   Occupational History  . [employer] Production designer, theatre/television/film    Social History Main Topics  . Smoking status: Former Games developer  . Smokeless tobacco: Not on file  . Alcohol Use: Yes     Comment: occasional use  . Drug Use: No  . Sexually Active: Not on file   Other Topics Concern  . Not on file   Social History Narrative   Regular exercise, yes    Current Outpatient Prescriptions on File Prior to Visit  Medication Sig Dispense Refill  . Fexofenadine HCl (ALLEGRA PO) Take by mouth daily.        . Azelastine-Fluticasone  (DYMISTA) 137-50 MCG/ACT SUSP Place 1 Act into the nose 1 day or 1 dose.  23 g  1    No Known Allergies  Review of Systems  Review of Systems  Constitutional: Positive for fever and malaise/fatigue.  HENT: Positive for congestion and sore throat.   Eyes: Negative for discharge.  Respiratory: Positive for cough and sputum production. Negative for shortness of breath and wheezing.   Cardiovascular: Negative for chest pain, palpitations and leg swelling.  Gastrointestinal: Negative for nausea, abdominal pain and diarrhea.  Genitourinary: Negative for dysuria.  Musculoskeletal: Negative for Millikin.  Skin: Negative for rash.  Neurological: Positive for headaches. Negative for loss of consciousness.  Endo/Heme/Allergies: Negative for polydipsia.  Psychiatric/Behavioral: Negative for depression and suicidal ideas. The patient is not nervous/anxious and does not have insomnia.     Objective  BP 117/78  Pulse 73  Temp 97.8 F (36.6 C) (Temporal)  Ht 6\' 2"  (1.88 m)  Wt 214 lb 6.4 oz (97.251 kg)  BMI 27.53 kg/m2  SpO2 99%  Physical Exam  Physical Exam  Constitutional: He is oriented to person, place, and time and well-developed, well-nourished, and in no distress. No distress.  HENT:  Head: Normocephalic and atraumatic.       Right TM erythematous and dull, left TM clear. Oropharynx erythematous  Eyes: Conjunctivae normal are normal.  Neck: Neck supple.  No thyromegaly present.  Cardiovascular: Normal rate, regular rhythm and normal heart sounds.   No murmur heard. Pulmonary/Chest: Effort normal and breath sounds normal. No respiratory distress.  Abdominal: He exhibits no distension and no mass. There is no tenderness.  Musculoskeletal: He exhibits no edema.  Neurological: He is alert and oriented to person, place, and time.  Skin: Skin is warm.  Psychiatric: Memory, affect and judgment normal.    Lab Results  Component Value Date   TSH 0.49 08/15/2009   Lab Results   Component Value Date   WBC 6.7 08/15/2009   HGB 16.6 08/15/2009   HCT 48.1 08/15/2009   MCV 90.8 08/15/2009   PLT 231.0 08/15/2009   Lab Results  Component Value Date   CREATININE 1.0 08/15/2009   BUN 12 08/15/2009   NA 136 08/15/2009   K 4.4 08/15/2009   CL 104 08/15/2009   CO2 32 08/15/2009   Lab Results  Component Value Date   ALT 17 08/15/2009   AST 15 08/15/2009   ALKPHOS 57 08/15/2009   BILITOT 0.9 08/15/2009   Lab Results  Component Value Date   CHOL 182 08/15/2009   Lab Results  Component Value Date   HDL 44.60 08/15/2009   Lab Results  Component Value Date   LDLCALC 123* 08/15/2009   Lab Results  Component Value Date   TRIG 71.0 08/15/2009   Lab Results  Component Value Date   CHOLHDL 4 08/15/2009     Assessment & Plan  SINUSITIS- ACUTE-NOS With ROM. Started on Augmentin, Mucinex and asked to increase hydration and start probiotics

## 2012-10-24 NOTE — Patient Instructions (Addendum)
Start a probiotic such as yogurt or Digestive Advantage/Align, etc  Sinusitis Sinusitis is redness, soreness, and swelling (inflammation) of the paranasal sinuses. Paranasal sinuses are air pockets within the bones of your face (beneath the eyes, the middle of the forehead, or above the eyes). In healthy paranasal sinuses, mucus is able to drain out, and air is able to circulate through them by way of your nose. However, when your paranasal sinuses are inflamed, mucus and air can become trapped. This can allow bacteria and other germs to grow and cause infection. Sinusitis can develop quickly and last only a short time (acute) or continue over a long period (chronic). Sinusitis that lasts for more than 12 weeks is considered chronic.  CAUSES  Causes of sinusitis include:  Allergies.  Structural abnormalities, such as displacement of the cartilage that separates your nostrils (deviated septum), which can decrease the air flow through your nose and sinuses and affect sinus drainage.  Functional abnormalities, such as when the small hairs (cilia) that line your sinuses and help remove mucus do not work properly or are not present. SYMPTOMS  Symptoms of acute and chronic sinusitis are the same. The primary symptoms are pain and pressure around the affected sinuses. Other symptoms include:  Upper toothache.  Earache.  Headache.  Bad breath.  Decreased sense of smell and taste.  A cough, which worsens when you are lying flat.  Fatigue.  Fever.  Thick drainage from your nose, which often is green and may contain pus (purulent).  Swelling and warmth over the affected sinuses. DIAGNOSIS  Your caregiver will perform a physical exam. During the exam, your caregiver may:  Look in your nose for signs of abnormal growths in your nostrils (nasal polyps).  Tap over the affected sinus to check for signs of infection.  View the inside of your sinuses (endoscopy) with a special imaging device  with a light attached (endoscope), which is inserted into your sinuses. If your caregiver suspects that you have chronic sinusitis, one or more of the following tests may be recommended:  Allergy tests.  Nasal culture A sample of mucus is taken from your nose and sent to a lab and screened for bacteria.  Nasal cytology A sample of mucus is taken from your nose and examined by your caregiver to determine if your sinusitis is related to an allergy. TREATMENT  Most cases of acute sinusitis are related to a viral infection and will resolve on their own within 10 days. Sometimes medicines are prescribed to help relieve symptoms (pain medicine, decongestants, nasal steroid sprays, or saline sprays).  However, for sinusitis related to a bacterial infection, your caregiver will prescribe antibiotic medicines. These are medicines that will help kill the bacteria causing the infection.  Rarely, sinusitis is caused by a fungal infection. In theses cases, your caregiver will prescribe antifungal medicine. For some cases of chronic sinusitis, surgery is needed. Generally, these are cases in which sinusitis recurs more than 3 times per year, despite other treatments. HOME CARE INSTRUCTIONS   Drink plenty of water. Water helps thin the mucus so your sinuses can drain more easily.  Use a humidifier.  Inhale steam 3 to 4 times a day (for example, sit in the bathroom with the shower running).  Apply a warm, moist washcloth to your face 3 to 4 times a day, or as directed by your caregiver.  Use saline nasal sprays to help moisten and clean your sinuses.  Take over-the-counter or prescription medicines for pain, discomfort,  or fever only as directed by your caregiver. SEEK IMMEDIATE MEDICAL CARE IF:  You have increasing pain or severe headaches.  You have nausea, vomiting, or drowsiness.  You have swelling around your face.  You have vision problems.  You have a stiff neck.  You have difficulty  breathing. MAKE SURE YOU:   Understand these instructions.  Will watch your condition.  Will get help right away if you are not doing well or get worse. Document Released: 10/26/2005 Document Revised: 01/18/2012 Document Reviewed: 11/10/2011 [employer] Patient Information 2013 [place], [place].

## 2012-10-24 NOTE — Assessment & Plan Note (Addendum)
With ROM. Started on Augmentin, Mucinex and asked to increase hydration and start probiotics

## 2012-11-09 HISTORY — PX: NASAL SINUS SURGERY: SHX719

## 2013-01-03 ENCOUNTER — Ambulatory Visit (INDEPENDENT_AMBULATORY_CARE_PROVIDER_SITE_OTHER): Payer: BC Managed Care – PPO | Admitting: Internal Medicine

## 2013-01-03 ENCOUNTER — Other Ambulatory Visit: Payer: Self-pay | Admitting: Internal Medicine

## 2013-01-03 ENCOUNTER — Encounter: Payer: Self-pay | Admitting: Internal Medicine

## 2013-01-03 VITALS — BP 118/82 | HR 69 | Temp 97.3°F | Ht 74.0 in | Wt 214.0 lb

## 2013-01-03 DIAGNOSIS — H6690 Otitis media, unspecified, unspecified ear: Secondary | ICD-10-CM

## 2013-01-03 DIAGNOSIS — J019 Acute sinusitis, unspecified: Secondary | ICD-10-CM

## 2013-01-03 DIAGNOSIS — H6692 Otitis media, unspecified, left ear: Secondary | ICD-10-CM

## 2013-01-03 MED ORDER — AMOXICILLIN-POT CLAVULANATE 875-125 MG PO TABS: 1.0000 | ORAL_TABLET | Freq: Two times a day (BID) | ORAL | Status: DC

## 2013-01-03 NOTE — Progress Notes (Signed)
HPI  Pt presents to the clinic today with c/o sinus pain and tenderness, headache, fever, nasal congestion and ear pain. He has a history of allergies and does take Zyrtec daily. He has also taken some Mucinex. The symptoms continue to get worse daily. He has had sick contacts.  Review of Systems    Past Medical History  Diagnosis Date  . Headache 02/27/2010  . LOW BACK PAIN 08/15/2009  . SINUSITIS- ACUTE-NOS 12/30/2009  . TOBACCO USE, QUIT 08/15/2009  . UPPER RESPIRATORY INFECTION, ACUTE 10/21/2010  . Environmental allergies     Family History  Problem Relation Age of Onset  . Hypertension Other   . Hyperlipidemia Other     History   Social History  . Marital Status: Married    Spouse Name: N/A    Number of Children: N/A  . Years of Education: N/A   Occupational History  . [employer] Production designer, theatre/television/film    Social History Main Topics  . Smoking status: Former Games developer  . Smokeless tobacco: Not on file  . Alcohol Use: Yes     Comment: occasional use  . Drug Use: No  . Sexually Active: Not on file   Other Topics Concern  . Not on file   Social History Narrative   Regular exercise, yes    No Known Allergies   Constitutional: Positive headache, fatigue and fever. Denies abrupt weight changes.  HEENT:  Positive eye pain, pressure behind the eyes, facial pain, ear pain, nasal congestion and sore throat. Denies eye redness, ringing in the ears, wax buildup, runny nose or bloody nose. Respiratory: Positive cough. Denies difficulty breathing or shortness of breath.  Cardiovascular: Denies chest pain, chest tightness, palpitations or swelling in the hands or feet.   No other specific complaints in a complete review of systems (except as listed in HPI above).  Objective:    BP 118/82  Pulse 69  Temp(Src) 97.3 F (36.3 C) (Oral)  Ht 6\' 2"  (1.88 m)  Wt 214 lb (97.07 kg)  BMI 27.46 kg/m2  SpO2 96% Wt Readings from Last 3 Encounters:  01/03/13 214 lb (97.07 kg)  10/24/12 214 lb 6.4  oz (97.251 kg)  11/04/11 205 lb (92.987 kg)    General: Appears his stated age, well developed, well nourished in NAD. HEENT: Head: normal shape and size; Eyes: sclera white, no icterus, conjunctiva pink, PERRLA and EOMs intact; Ears: Tm's red and injected and intact, distorted light reflex; Nose: mucosa pink and moist, septum midline; Throat/Mouth: + PND. Teeth present, mucosa pink and moist, no exudate noted, no lesions or ulcerations noted.  Neck: Mild cervical lymphadenopathy. Neck supple, trachea midline. No massses, lumps or thyromegaly present.  Cardiovascular: Normal rate and rhythm. S1,S2 noted.  No murmur, rubs or gallops noted. No JVD or BLE edema. No carotid bruits noted. Pulmonary/Chest: Normal effort and positive vesicular breath sounds. No respiratory distress. No wheezes, rales or ronchi noted.      Assessment & Plan:   Acute bacterial sinusitis with left otitis media, new onset with additional workup required:  Can use a Neti Pot which can be purchased from your local drug store. Flonase 2 sprays each nostril for 3 days and then as needed. Augmentin BID for 10 days  RTC as needed or if symptoms persist.

## 2013-01-03 NOTE — Patient Instructions (Signed)

## 2013-01-05 ENCOUNTER — Ambulatory Visit: Payer: BC Managed Care – PPO | Admitting: Internal Medicine

## 2013-01-20 ENCOUNTER — Ambulatory Visit (INDEPENDENT_AMBULATORY_CARE_PROVIDER_SITE_OTHER): Payer: BC Managed Care – PPO | Admitting: Internal Medicine

## 2013-01-20 ENCOUNTER — Encounter: Payer: Self-pay | Admitting: Internal Medicine

## 2013-01-20 VITALS — BP 130/80 | HR 80 | Temp 98.1°F | Resp 16 | Wt 214.0 lb

## 2013-01-20 DIAGNOSIS — H6692 Otitis media, unspecified, left ear: Secondary | ICD-10-CM

## 2013-01-20 DIAGNOSIS — H6690 Otitis media, unspecified, unspecified ear: Secondary | ICD-10-CM

## 2013-01-20 MED ORDER — CEFUROXIME AXETIL 500 MG PO TABS: 500.0000 mg | ORAL_TABLET | Freq: Two times a day (BID) | ORAL | Status: DC

## 2013-01-20 MED ORDER — MOXIFLOXACIN HCL 400 MG PO TABS: 400.0000 mg | ORAL_TABLET | Freq: Every day | ORAL | Status: DC

## 2013-01-20 MED ORDER — PSEUDOEPHEDRINE HCL ER 120 MG PO TB12: 120.0000 mg | ORAL_TABLET | Freq: Two times a day (BID) | ORAL | Status: DC | PRN

## 2013-01-20 NOTE — Assessment & Plan Note (Addendum)
3/14 L subacute Sudafed bid  Avelox 400 mg qd x 2 wks (if too expensive - Ceftin 500 mg bid x 2 wks)

## 2013-01-20 NOTE — Progress Notes (Signed)
Patient ID: Dylan Pope, male   DOB: 01-18-1976, 37 y.o.   MRN: [id number]  Subjective:    Patient ID: Dylan Pope, male    DOB: Aug 26, 1976, 37 y.o.   MRN: [id number]  Otalgia  There is pain in the left ear. This is a new problem. The current episode started 1 to 4 weeks ago. The problem occurs every few minutes. The problem has been gradually worsening. There has been no fever. The pain is mild. Associated symptoms include rhinorrhea. Pertinent negatives include no sore throat. He has tried acetaminophen for the symptoms. The treatment provided mild relief. His past medical history is significant for hearing loss.        Review of Systems  HENT: Positive for nosebleeds, rhinorrhea and postnasal drip. Negative for sore throat and sinus pressure.        Objective:   Physical Exam  Constitutional: He appears well-developed and well-nourished. No distress.  HENT:  Mouth/Throat: No oropharyngeal exudate.  Swollen nasal mucsa Bloody white d/c on R  Eryth throat  Pulmonary/Chest: He has no wheezes. He has no rales.  Skin: No rash noted. He is not diaphoretic.  Psychiatric: Judgment normal.          Assessment & Plan:

## 2013-03-03 ENCOUNTER — Ambulatory Visit (INDEPENDENT_AMBULATORY_CARE_PROVIDER_SITE_OTHER): Payer: BC Managed Care – PPO | Admitting: Internal Medicine

## 2013-03-03 ENCOUNTER — Encounter: Payer: Self-pay | Admitting: Internal Medicine

## 2013-03-03 VITALS — BP 130/90 | HR 76 | Temp 98.0°F | Resp 16 | Wt 208.0 lb

## 2013-03-03 DIAGNOSIS — J310 Chronic rhinitis: Secondary | ICD-10-CM

## 2013-03-03 DIAGNOSIS — J019 Acute sinusitis, unspecified: Secondary | ICD-10-CM

## 2013-03-03 MED ORDER — AMOXICILLIN-POT CLAVULANATE 875-125 MG PO TABS: 1.0000 | ORAL_TABLET | Freq: Two times a day (BID) | ORAL | Status: DC

## 2013-03-03 MED ORDER — MOMETASONE FUROATE 50 MCG/ACT NA SUSP: 2.0000 | Freq: Every day | NASAL | Status: DC

## 2013-03-03 NOTE — Progress Notes (Signed)
   Subjective:     Sinusitis This is a recurrent problem. The current episode started more than 1 month ago. There has been no fever. The pain is moderate. Associated symptoms include congestion. Pertinent negatives include no sinus pressure.        Review of Systems  HENT: Positive for nosebleeds, congestion and postnasal drip. Negative for sinus pressure.        Objective:   Physical Exam  Constitutional: He appears well-developed and well-nourished. No distress.  HENT:  Mouth/Throat: No oropharyngeal exudate.  Swollen nasal mucsa Bloody white d/c on R >L Eryth throat  Pulmonary/Chest: He has no wheezes. He has no rales.  Skin: No rash noted. He is not diaphoretic.  Psychiatric: Judgment normal.          Assessment & Plan:

## 2013-03-03 NOTE — Assessment & Plan Note (Signed)
12/12 3/14 4/14 Augmentin 2 wks ENT cons today

## 2013-03-03 NOTE — Assessment & Plan Note (Signed)
ENT cons today Nasonex, Allegra

## 2013-03-08 ENCOUNTER — Emergency Department (HOSPITAL_COMMUNITY)
Admission: EM | Admit: 2013-03-08 | Discharge: 2013-03-08 | Disposition: A | Discharge status: Home / Self Care | Payer: BC Managed Care – PPO | Attending: Emergency Medicine | Admitting: Emergency Medicine

## 2013-03-08 ENCOUNTER — Encounter (HOSPITAL_COMMUNITY): Payer: Self-pay | Admitting: *Deleted

## 2013-03-08 ENCOUNTER — Emergency Department (HOSPITAL_COMMUNITY): Payer: BC Managed Care – PPO

## 2013-03-08 DIAGNOSIS — Z79899 Other long term (current) drug therapy: Secondary | ICD-10-CM | POA: Insufficient documentation

## 2013-03-08 DIAGNOSIS — R634 Abnormal weight loss: Secondary | ICD-10-CM | POA: Insufficient documentation

## 2013-03-08 DIAGNOSIS — Z87891 Personal history of nicotine dependence: Secondary | ICD-10-CM | POA: Insufficient documentation

## 2013-03-08 DIAGNOSIS — Z791 Long term (current) use of non-steroidal anti-inflammatories (NSAID): Secondary | ICD-10-CM | POA: Insufficient documentation

## 2013-03-08 DIAGNOSIS — Z8739 Personal history of other diseases of the musculoskeletal system and connective tissue: Secondary | ICD-10-CM | POA: Insufficient documentation

## 2013-03-08 DIAGNOSIS — Z8709 Personal history of other diseases of the respiratory system: Secondary | ICD-10-CM | POA: Insufficient documentation

## 2013-03-08 DIAGNOSIS — R51 Headache: Secondary | ICD-10-CM | POA: Insufficient documentation

## 2013-03-08 MED ORDER — SODIUM CHLORIDE 0.9 % IV SOLN
INTRAVENOUS | Status: DC
  Administered 2013-03-08: 22:00:00 via INTRAVENOUS

## 2013-03-08 MED ORDER — DIPHENHYDRAMINE HCL 50 MG/ML IJ SOLN
25.0000 mg | Freq: Once | INTRAMUSCULAR | Status: AC
  Administered 2013-03-08: 25 mg via INTRAVENOUS
  Filled 2013-03-08: qty 1

## 2013-03-08 MED ORDER — METOCLOPRAMIDE HCL 5 MG/ML IJ SOLN
10.0000 mg | Freq: Once | INTRAMUSCULAR | Status: AC
  Administered 2013-03-08: 10 mg via INTRAVENOUS
  Filled 2013-03-08: qty 2

## 2013-03-08 MED ORDER — KETOROLAC TROMETHAMINE 30 MG/ML IJ SOLN
30.0000 mg | Freq: Once | INTRAMUSCULAR | Status: AC
  Administered 2013-03-08: 30 mg via INTRAVENOUS
  Filled 2013-03-08: qty 1

## 2013-03-08 NOTE — ED Notes (Signed)
Pt c/o persistent HA x's 9 days. Reports was treated for sinusitis but has not gotten better. Reports had CT of sinuses at ENT doctor and told there was no sinus infection. Pt denies hx of migraines. Denies photosensitivity. Reports nausea, denies vomiting.

## 2013-03-08 NOTE — ED Provider Notes (Signed)
History  This chart was scribed for non-physician practitioner working with Loren Racer, MD by Greggory Stallion, ED scribe. This patient was seen in room WTR8/WTR8 and the patient's care was started at 9:06 PM.  CSN: [id number]  Arrival date & time 03/08/13  1941     Chief Complaint  Patient presents with  . Headache    The history is provided by the patient. No language interpreter was used.    Dylan Pope is a 37 y.o. male who presents to the Emergency Department with chief complaint of a constant, severe headache that started 9 days ago. He states is pain is 9/10. He states he's been very tired and had no appetite. Pt stated he had an appointment with an ENT Friday but went to the PCP first and was diagnosed with a sinus infection and given antibiotics with no relief. Pt denies fever, chills, nausea, vomiting, diarrhea, numbness, tingling, cough, visual disturbance, change in taste, head injury, SOB and any other pain. He states he has lost about 10 lbs over the last 10 days. Pt's wife states that he was given a CT head scan previously and it was said that there was so sign of a sinus infection. Pt states he has taken Sudafed, Advil, and Tylenol with no relief.  Pt states he has an appointment tomorrow with the ENT.    Past Medical History  Diagnosis Date  . Headache 02/27/2010  . LOW BACK PAIN 08/15/2009  . SINUSITIS- ACUTE-NOS 12/30/2009  . TOBACCO USE, QUIT 08/15/2009  . UPPER RESPIRATORY INFECTION, ACUTE 10/21/2010  . Environmental allergies     History reviewed. No pertinent past surgical history.  Family History  Problem Relation Age of Onset  . Hypertension Other   . Hyperlipidemia Other     History  Substance Use Topics  . Smoking status: Former Games developer  . Smokeless tobacco: Not on file  . Alcohol Use: Yes     Comment: occasional use      Review of Systems A complete 10 system review of systems was obtained and all systems are negative except as noted in the HPI  and PMH.    Allergies  Review of patient's allergies indicates no known allergies.  Home Medications   Current Outpatient Rx  Name  Route  Sig  Dispense  Refill  . amoxicillin-clavulanate (AUGMENTIN) 875-125 MG per tablet   Oral   Take 1 tablet by mouth 2 (two) times daily.   28 tablet   1   . fexofenadine (ALLEGRA) 180 MG tablet   Oral   Take 180 mg by mouth daily.         . mometasone (NASONEX) 50 MCG/ACT nasal spray   Nasal   Place 2 sprays into the nose daily.   17 g   12   . Multiple Vitamin (MULTIVITAMIN WITH MINERALS) TABS   Oral   Take 1 tablet by mouth daily.         . pseudoephedrine (SUDAFED) 120 MG 12 hr tablet   Oral   Take 1 tablet (120 mg total) by mouth 2 (two) times daily as needed for congestion.   20 tablet   1     BP 126/76  Pulse 75  Temp(Src) 97.8 F (36.6 C) (Oral)  Resp 18  SpO2 100%  Physical Exam  Nursing note and vitals reviewed. Constitutional: He is oriented to person, place, and time. He appears well-developed and well-nourished. No distress.  HENT:  Head: Normocephalic and atraumatic.  Right Ear:  External ear normal.  Left Ear: External ear normal.  Bilateral tympanic membranes are clear and without evidence of infection.  Eyes: Conjunctivae and EOM are normal. Pupils are equal, round, and reactive to light. Right eye exhibits no discharge. Left eye exhibits no discharge. No scleral icterus.  No papilledema  Neck: Normal range of motion. Neck supple. No tracheal deviation present.  No neck stiffness or meningeal signs.  Cardiovascular: Normal rate, regular rhythm and normal heart sounds.  Exam reveals no gallop and no friction rub.   No murmur heard. Pulmonary/Chest: Effort normal and breath sounds normal. No respiratory distress. He has no wheezes. He has no rales. He exhibits no tenderness.  Abdominal: Soft. Bowel sounds are normal. He exhibits no distension and no mass. There is no tenderness. There is no rebound and no  guarding.  Musculoskeletal: Normal range of motion. He exhibits no edema and no tenderness.  Normal gait.  Neurological: He is alert and oriented to person, place, and time. He has normal reflexes.  CN 3-12 intact. Sensation and strength intact bilaterally.  Skin: Skin is warm and dry.  Psychiatric: He has a normal mood and affect. His behavior is normal. Judgment and thought content normal.    ED Course  Procedures (including critical care time)  DIAGNOSTIC STUDIES: Oxygen Saturation is 100% on RA, normal by my interpretation.    COORDINATION OF CARE: 9:21 PM-Discussed treatment plan which includes head CT with pt at bedside and pt agreed to plan.   9:47 PM- Informed pt of negative CT. Will give migraine cocktail and re-evaluate after that.   Labs Reviewed - No data to display Ct Head Wo Contrast  03/08/2013  *RADIOLOGY REPORT*  Clinical Data: Constant severe headache starting 9 days ago.  Tired and no appetite.  CT HEAD WITHOUT CONTRAST  Technique:  Contiguous axial images were obtained from the base of the skull through the vertex without contrast.  Comparison: CT sinuses 07/01/2011  Findings: The ventricles and sulci are symmetrical without significant effacement, displacement, or dilatation. No mass effect or midline shift. No abnormal extra-axial fluid collections. The grey-white matter junction is distinct. Basal cisterns are not effaced. No acute intracranial hemorrhage. No depressed skull fractures.  Visualized paranasal sinuses and mastoid air cells are not opacified.  There is minimal mucosal thickening in the sphenoid sinuses.  IMPRESSION: No acute intracranial abnormalities.   Original Report Authenticated By: Burman Nieves, M.D.      1. Headache       MDM  Patient with headache.  Diagnosed with sinus infection by PCP, and sent to ENT.  ENT did CT maxillofacial, and saw no evidence of infection.  ENT recommended that he follow-up with PCP and get a head CT.  Patient  came to ED tonight to get head CT because he said he could not wait until tomorrow.  Based on the patient's persistent headache for 9 days, and his complaint of weight loss, I will scan his head to evaluate for mass.  No evidence of meningitis, no fevers, or neck stiffness.  Also symptoms have been ongoing for 9 days.  Patient given migraine cocktail.  Patient notes significant relief with migraine cocktail.  Pain went from 9/10 to 4/10.  Will discharge to home with PCP follow-up tomorrow.  Patient understands and agrees with the plan.  He is stable and ready for discharge.     I personally performed the services described in this documentation, which was scribed in my presence. The recorded information has been  reviewed and is accurate.     Roxy Horseman, PA-C 03/08/13 2339

## 2013-03-09 ENCOUNTER — Telehealth: Payer: Self-pay | Admitting: *Deleted

## 2013-03-09 ENCOUNTER — Encounter: Payer: Self-pay | Admitting: Family Medicine

## 2013-03-09 ENCOUNTER — Ambulatory Visit (INDEPENDENT_AMBULATORY_CARE_PROVIDER_SITE_OTHER): Payer: BC Managed Care – PPO | Admitting: Family Medicine

## 2013-03-09 VITALS — BP 102/70 | Temp 97.8°F | Wt 207.0 lb

## 2013-03-09 DIAGNOSIS — R51 Headache: Secondary | ICD-10-CM

## 2013-03-09 MED ORDER — SUMATRIPTAN SUCCINATE 50 MG PO TABS: ORAL_TABLET | ORAL | Status: DC

## 2013-03-09 MED ORDER — PROMETHAZINE HCL 50 MG/ML IJ SOLN
12.5000 mg | INTRAMUSCULAR | Status: AC
  Administered 2013-03-09: 12.5 mg via INTRAMUSCULAR

## 2013-03-09 MED ORDER — KETOROLAC TROMETHAMINE 60 MG/2ML IM SOLN
30.0000 mg | Freq: Once | INTRAMUSCULAR | Status: AC
  Administered 2013-03-09: 30 mg via INTRAMUSCULAR

## 2013-03-09 NOTE — Telephone Encounter (Signed)
Pt's wife informed. Transferred to scheduler.

## 2013-03-09 NOTE — Addendum Note (Signed)
Addended by: Azucena Freed on: 03/09/2013 02:50 PM   Modules accepted: Orders

## 2013-03-09 NOTE — Telephone Encounter (Signed)
Pt's wife calling stating pt has had H/A x 10 days. He went to ENT and was advised to f/u with PCP.  Appt is scheduled with PCP on 03-14-13. The pain was so bad last night he had to go to ED. They gave him meds then that only helped last night, but HA has returned. Pain scale is 9/10. Pt's wife is requesting Rx as she states he cant wait to see Dr. Posey Rea when he returns on Tuesday. Please advise.

## 2013-03-09 NOTE — Patient Instructions (Signed)
-  take your allergy medicine and nasonex daily  -use sumatriptan according to instructions -As we discussed, we have prescribed a new medication for you at this appointment. We discussed the common and serious potential adverse effects of this medication and you can review these and more with the pharmacist when you pick up your medication.  Please follow the instructions for use carefully and notify us immediately if you have any problems taking this medication.  -consider eye exam  -follow up with your doctor as scheduled

## 2013-03-09 NOTE — Progress Notes (Signed)
Chief Complaint  Patient presents with  . Headache    x 10 days     HPI:  Acute visit for headache/sinus congestion: - per PCP note 4/25 > 1 month hx of nasal congestion, PND, nosebleeds, sinus pain - tx with 2 week course of Augmentin -saw ENT yesterday (told to keep taking the abx but CT sinuses neg) and in ED with neg CT head 4/30 - tx for migraine in ED which resolved HA, but returned today -per [facility] actually looks like has had similar complaints including HA and sinus pain on many occassions as far back as 2012 and always tx for allergies and sinusitis -has follow up with PCP in a few days, but wants something for pain in the meantime and PCP office said had to be seen per phone notes -reports pain is right side of face, forehead, neck, back of head  -other symptoms: drainage in throat, ears feel full -reports has had similar headaches before with the sinus issues and eyes tear up with them at times - sometimes left sided, sometimes right sided, a little light sensitivity -denies: fevers, chills, neck stiffness, vision changes, CP, SOB, nausea, vomiting -he has headaches about 1 time per week chronically his whole life and most of the time tylenol or ibuprofen helps -just started nasonex  ROS: See pertinent positives and negatives per HPI.  Past Medical History  Diagnosis Date  . Headache 02/27/2010  . LOW BACK PAIN 08/15/2009  . SINUSITIS- ACUTE-NOS 12/30/2009  . TOBACCO USE, QUIT 08/15/2009  . UPPER RESPIRATORY INFECTION, ACUTE 10/21/2010  . Environmental allergies     Family History  Problem Relation Age of Onset  . Hypertension Other   . Hyperlipidemia Other     History   Social History  . Marital Status: Married    Spouse Name: N/A    Number of Children: N/A  . Years of Education: N/A   Occupational History  . [employer] designer, theatre/television/film    Social History Main Topics  . Smoking status: Former Games developer  . Smokeless tobacco: None  . Alcohol Use: Yes     Comment: occasional  use  . Drug Use: No  . Sexually Active: None   Other Topics Concern  . None   Social History Narrative   Regular exercise, yes    Current outpatient prescriptions:amoxicillin-clavulanate (AUGMENTIN) 875-125 MG per tablet, Take 1 tablet by mouth 2 (two) times daily., Disp: 28 tablet, Rfl: 1;  fexofenadine (ALLEGRA) 180 MG tablet, Take 180 mg by mouth daily., Disp: , Rfl: ;  mometasone (NASONEX) 50 MCG/ACT nasal spray, Place 2 sprays into the nose daily., Disp: 17 g, Rfl: 12;  Multiple Vitamin (MULTIVITAMIN WITH MINERALS) TABS, Take 1 tablet by mouth daily., Disp: , Rfl:  pseudoephedrine (SUDAFED) 120 MG 12 hr tablet, Take 1 tablet (120 mg total) by mouth 2 (two) times daily as needed for congestion., Disp: 20 tablet, Rfl: 1;  SUMAtriptan (IMITREX) 50 MG tablet, One by mouth at onset of headache, may try one more in 2 hours if headache persist. No more then 2 tabs in 24 hours., Disp: 10 tablet, Rfl: 0  EXAM:  Filed Vitals:   03/09/13 1415  BP: 102/70  Temp: 97.8 F (36.6 C)    Body mass index is 26.57 kg/(m^2).  GENERAL: vitals reviewed and listed above, alert, oriented, appears well hydrated and in no acute distress  HEENT: atraumatic, conjunttiva clear, no obvious abnormalities on inspection of external nose and ears, normal appearance of ear canals and  TMs, clear nasal congestion, mild post oropharyngeal erythema with PND, no tonsillar edema or exudate, no sinus TTP  NECK: no obvious masses on inspection, no meningeal signs, no LAD  LUNGS: clear to auscultation bilaterally, no wheezes, rales or rhonchi, good air movement  CV: HRRR, no peripheral edema  MS: moves all extremities without noticeable abnormality  NEURO: PERRLA, CN II-XII grossly intact, finger to nose normal, gait normal  PSYCH: pleasant and cooperative, no obvious depression or anxiety  ASSESSMENT AND PLAN:  Discussed the following assessment and plan:  Headache - Plan: SUMAtriptan (IMITREX) 50 MG  tablet  -frequent facial pain and HAs over several years per review of chart and HA 1x per week forever per patient. Often treated as acute sinusitis, but recent CT sinuses neg during acute tx for sinusitis. -suspect pt has combination of allergic rhinitis and migraine or variant HAs, cluster HA vs trigeminal neuralgia - reports he actually was told this 2 years ago by a ENT doctor - he saw allergist and does have allergies to pollens -CT head neg so reassuring this is no serious intercranial pathology. TA lest likely per hx and exam. Neuro exam normal, no LAD or temporal artery TTP. -Tx acutely here with Toradol and phenergan, triptan given to try if recurs prior to follow up with PCP for further eval chronic facial pain and HAs. -tx allergies with daily antihistamine and nasonex - Advised should also have eye and will likely undergo lab workup with PCP and may need to see neurology.  -follow up with PCP as scheduled in a few days   Patient Instructions  -take your allergy medicine and nasonex daily  -use sumatriptan according to instructions -As we discussed, we have prescribed a new medication for you at this appointment. We discussed the common and serious potential adverse effects of this medication and you can review these and more with the pharmacist when you pick up your medication.  Please follow the instructions for use carefully and notify us immediately if you have any problems taking this medication.  -consider eye exam  -follow up with your doctor as scheduled       Dylan Pope, Dylan Pope.

## 2013-03-09 NOTE — Telephone Encounter (Signed)
He needs to be seen

## 2013-03-09 NOTE — ED Provider Notes (Signed)
Medical screening examination/treatment/procedure(s) were performed by non-physician practitioner and as supervising physician I was immediately available for consultation/collaboration.   Zylie Mumaw, MD 03/09/13 0012 

## 2013-03-14 ENCOUNTER — Other Ambulatory Visit (INDEPENDENT_AMBULATORY_CARE_PROVIDER_SITE_OTHER): Payer: BC Managed Care – PPO

## 2013-03-14 ENCOUNTER — Ambulatory Visit (INDEPENDENT_AMBULATORY_CARE_PROVIDER_SITE_OTHER): Payer: BC Managed Care – PPO | Admitting: Internal Medicine

## 2013-03-14 ENCOUNTER — Encounter: Payer: Self-pay | Admitting: Internal Medicine

## 2013-03-14 VITALS — BP 130/88 | HR 84 | Temp 98.4°F | Resp 16 | Wt 201.0 lb

## 2013-03-14 DIAGNOSIS — J310 Chronic rhinitis: Secondary | ICD-10-CM

## 2013-03-14 DIAGNOSIS — R51 Headache: Secondary | ICD-10-CM

## 2013-03-14 LAB — CBC WITH DIFFERENTIAL/PLATELET
Basophils Relative: 0.4 % (ref 0.0–3.0)
Eosinophils Relative: 0.3 % (ref 0.0–5.0)
HCT: 48 % (ref 39.0–52.0)
Hemoglobin: 16.8 g/dL (ref 13.0–17.0)
Lymphs Abs: 1.5 10*3/uL (ref 0.7–4.0)
Monocytes Relative: 6.4 % (ref 3.0–12.0)
Neutro Abs: 9.1 10*3/uL — ABNORMAL HIGH (ref 1.4–7.7)
RDW: 13.2 % (ref 11.5–14.6)
WBC: 11.4 10*3/uL — ABNORMAL HIGH (ref 4.5–10.5)

## 2013-03-14 MED ORDER — INDOMETHACIN 50 MG PO CAPS: 50.0000 mg | ORAL_CAPSULE | Freq: Two times a day (BID) | ORAL | Status: DC

## 2013-03-14 MED ORDER — NORTRIPTYLINE HCL 10 MG PO CAPS: 10.0000 mg | ORAL_CAPSULE | Freq: Every day | ORAL | Status: DC

## 2013-03-14 MED ORDER — SUMATRIPTAN SUCCINATE 100 MG PO TABS: 100.0000 mg | ORAL_TABLET | ORAL | Status: DC | PRN

## 2013-03-14 NOTE — Progress Notes (Deleted)
Patient ID: Dylan Pope, male   DOB: May 20, 1976, 37 y.o.   MRN: [id number]

## 2013-03-14 NOTE — Assessment & Plan Note (Signed)
Finish abx

## 2013-03-14 NOTE — Assessment & Plan Note (Signed)
Sinusitis has triggered migraines

## 2013-03-15 LAB — BASIC METABOLIC PANEL
GFR: 79.57 mL/min (ref >60.00)
Glucose, Bld: 70 mg/dL (ref 70–99)
Potassium: 4.6 mEq/L (ref 3.5–5.1)
Sodium: 136 mEq/L (ref 135–145)

## 2013-03-15 LAB — VITAMIN B12: Vitamin B-12: 533 pg/mL (ref 211–911)

## 2013-03-15 LAB — HEPATIC FUNCTION PANEL
ALT: 79 U/L — ABNORMAL HIGH (ref 0–53)
AST: 43 U/L — ABNORMAL HIGH (ref 0–37)
Albumin: 4.3 g/dL (ref 3.5–5.2)
Total Protein: 7.6 g/dL (ref 6.0–8.3)

## 2013-03-19 NOTE — Progress Notes (Signed)
   Subjective:     Headache  This is a new problem. The current episode started in the past 7 days. The problem occurs daily. The problem has been gradually worsening. The pain is located in the temporal and frontal region. The pain radiates to the face. The pain quality is not similar to prior headaches. The quality of the pain is described as stabbing and shooting. The pain is at a severity of 9/10. The pain is severe. Pertinent negatives include no facial sweating, fever, rhinorrhea or sinus pressure. There is no history of cluster headaches or sinus disease.  Sinusitis This is a recurrent problem. The current episode started more than 1 month ago. There has been no fever. The pain is moderate. Associated symptoms include congestion and headaches. Pertinent negatives include no sinus pressure. The treatment provided significant relief.        Review of Systems  Constitutional: Negative for fever.  HENT: Positive for nosebleeds, congestion and postnasal drip. Negative for rhinorrhea and sinus pressure.   Neurological: Positive for headaches.       Objective:   Physical Exam  Constitutional: He appears well-developed and well-nourished. No distress.  HENT:  Mouth/Throat: No oropharyngeal exudate.  Swollen nasal mucsa Bloody white d/c on R >L Eryth throat  Pulmonary/Chest: He has no wheezes. He has no rales.  Skin: No rash noted. He is not diaphoretic.  Psychiatric: Judgment normal.          Assessment & Plan:

## 2013-08-02 ENCOUNTER — Ambulatory Visit (INDEPENDENT_AMBULATORY_CARE_PROVIDER_SITE_OTHER): Payer: BC Managed Care – PPO | Admitting: Internal Medicine

## 2013-08-02 ENCOUNTER — Encounter: Payer: Self-pay | Admitting: Internal Medicine

## 2013-08-02 ENCOUNTER — Other Ambulatory Visit (INDEPENDENT_AMBULATORY_CARE_PROVIDER_SITE_OTHER): Payer: BC Managed Care – PPO

## 2013-08-02 VITALS — BP 120/82 | HR 66 | Temp 97.1°F | Ht 74.0 in | Wt 214.0 lb

## 2013-08-02 DIAGNOSIS — R7989 Other specified abnormal findings of blood chemistry: Secondary | ICD-10-CM

## 2013-08-02 DIAGNOSIS — Z0000 Encounter for general adult medical examination without abnormal findings: Secondary | ICD-10-CM

## 2013-08-02 DIAGNOSIS — J310 Chronic rhinitis: Secondary | ICD-10-CM

## 2013-08-02 DIAGNOSIS — Z23 Encounter for immunization: Secondary | ICD-10-CM

## 2013-08-02 LAB — LIPID PANEL
Cholesterol: 195 mg/dL (ref 0–200)
VLDL: 48 mg/dL — ABNORMAL HIGH (ref 0.0–40.0)

## 2013-08-02 LAB — CBC WITH DIFFERENTIAL/PLATELET
Basophils Absolute: 0 10*3/uL (ref 0.0–0.1)
Eosinophils Absolute: 0.2 10*3/uL (ref 0.0–0.7)
Lymphocytes Relative: 23.5 % (ref 12.0–46.0)
Lymphs Abs: 2 10*3/uL (ref 0.7–4.0)
MCHC: 34.6 g/dL (ref 30.0–36.0)
Monocytes Relative: 8.9 % (ref 3.0–12.0)
Platelets: 268 10*3/uL (ref 150.0–400.0)
RDW: 12.6 % (ref 11.5–14.6)

## 2013-08-02 LAB — URINALYSIS
Ketones, ur: NEGATIVE
Leukocytes, UA: NEGATIVE
Specific Gravity, Urine: <=1.005 (ref 1.000–1.030)
Urine Glucose: NEGATIVE
pH: 6 (ref 5.0–8.0)

## 2013-08-02 LAB — BASIC METABOLIC PANEL
BUN: 13 mg/dL (ref 6–23)
Calcium: 9.4 mg/dL (ref 8.4–10.5)
GFR: 84.66 mL/min (ref >60.00)
Glucose, Bld: 77 mg/dL (ref 70–99)

## 2013-08-02 LAB — TSH: TSH: 0.86 u[IU]/mL (ref 0.35–5.50)

## 2013-08-02 LAB — HEPATIC FUNCTION PANEL
AST: 15 U/L (ref 0–37)
Albumin: 4.1 g/dL (ref 3.5–5.2)
Alkaline Phosphatase: 79 U/L (ref 39–117)

## 2013-08-02 LAB — LDL CHOLESTEROL, DIRECT: Direct LDL: 107.7 mg/dL

## 2013-08-02 NOTE — Patient Instructions (Signed)
Gluten free trial (no wheat products) for 4-6 weeks. OK to use gluten-free bread and gluten-free pasta.  Milk free trial (no milk, ice cream, cheese and yogurt) for 4-6 weeks. OK to use almond, coconut, rice or soy milk. "Almond breeze" brand tastes good.  

## 2013-08-02 NOTE — Assessment & Plan Note (Addendum)
We discussed age appropriate health related issues, including available/recomended screening tests and vaccinations. We discussed a need for adhering to healthy diet and exercise. Labs/EKG were reviewed/ordered. All questions were answered. Loose wt  BP nl

## 2013-08-02 NOTE — Progress Notes (Signed)
  Subjective:     HPI  The patient is here for a wellness exam. The patient has been doing well overall without major physical or psychological issues going on lately. He had a sinus surdery 6 wks ago  Wt Readings from Last 3 Encounters:  08/02/13 214 lb (97.07 kg)  03/14/13 201 lb (91.173 kg)  03/09/13 207 lb (93.895 kg)    BP Readings from Last 3 Encounters:  08/02/13 140/90  03/14/13 130/88  03/09/13 102/70       Review of Systems  Constitutional: Negative for appetite change, fatigue and unexpected weight change.  HENT: Positive for congestion and postnasal drip. Negative for nosebleeds, sore throat, sneezing, trouble swallowing, neck pain, neck stiffness and dental problem.   Eyes: Negative for itching and visual disturbance.  Respiratory: Negative for cough.   Cardiovascular: Negative for chest pain, palpitations and leg swelling.  Gastrointestinal: Negative for nausea, vomiting, diarrhea, blood in stool and abdominal distention.  Genitourinary: Negative for frequency and hematuria.  Musculoskeletal: Negative for myalgias, back pain, joint swelling and gait problem.  Skin: Negative for rash.  Neurological: Negative for dizziness, tremors, speech difficulty and weakness.  Psychiatric/Behavioral: Negative for suicidal ideas, sleep disturbance, dysphoric mood and agitation. The patient is not nervous/anxious.        Objective:   Physical Exam  Constitutional: He is oriented to person, place, and time. He appears well-developed. No distress.  NAD  HENT:  Mouth/Throat: Oropharynx is clear and moist.  Eyes: Conjunctivae are normal. Pupils are equal, round, and reactive to light.  Neck: Normal range of motion. No JVD present. No thyromegaly present.  Cardiovascular: Normal rate, regular rhythm, normal heart sounds and intact distal pulses.  Exam reveals no gallop and no friction rub.   No murmur heard. Pulmonary/Chest: Effort normal and breath sounds normal. No  respiratory distress. He has no wheezes. He has no rales. He exhibits no tenderness.  Abdominal: Soft. Bowel sounds are normal. He exhibits no distension and no mass. There is no tenderness. There is no rebound and no guarding.  Musculoskeletal: Normal range of motion. He exhibits no edema and no tenderness.  Lymphadenopathy:    He has no cervical adenopathy.  Neurological: He is alert and oriented to person, place, and time. He has normal reflexes. No cranial nerve deficit. He exhibits normal muscle tone. He displays a negative Romberg sign. Coordination and gait normal.  No meningeal signs  Skin: Skin is warm and dry. No rash noted.  Psychiatric: He has a normal mood and affect. His behavior is normal. Judgment and thought content normal.  Genitals WNL        Assessment & Plan:

## 2013-08-02 NOTE — Addendum Note (Signed)
Addended by: Merrilyn Puma on: 08/02/2013 04:15 PM   Modules accepted: Orders

## 2013-08-02 NOTE — Assessment & Plan Note (Addendum)
Just had a sinus surgery  Gluten free trial (no wheat products) for 4-6 weeks. OK to use gluten-free bread and gluten-free pasta.  Milk free trial (no milk, ice cream, cheese and yogurt) for 4-6 weeks. OK to use almond, coconut, rice or soy milk. "Almond breeze" brand tastes good.

## 2014-01-01 ENCOUNTER — Ambulatory Visit (INDEPENDENT_AMBULATORY_CARE_PROVIDER_SITE_OTHER): Payer: BC Managed Care – PPO | Admitting: Family Medicine

## 2014-01-01 ENCOUNTER — Encounter: Payer: Self-pay | Admitting: Family Medicine

## 2014-01-01 VITALS — BP 113/78 | HR 92 | Temp 98.4°F | Resp 18 | Ht 74.0 in | Wt 217.0 lb

## 2014-01-01 DIAGNOSIS — R51 Headache: Secondary | ICD-10-CM

## 2014-01-01 DIAGNOSIS — G2581 Restless legs syndrome: Secondary | ICD-10-CM

## 2014-01-01 MED ORDER — PAMELOR 10 MG PO CAPS: 10.0000 mg | ORAL_CAPSULE | Freq: Every day | ORAL | Status: DC

## 2014-01-01 MED ORDER — FROVATRIPTAN SUCCINATE 2.5 MG PO TABS: ORAL_TABLET | ORAL | Status: DC

## 2014-01-01 NOTE — Progress Notes (Signed)
Pre visit review using our clinic review tool, if applicable. No additional management support is needed unless otherwise documented below in the visit note. 

## 2014-01-01 NOTE — Progress Notes (Signed)
OFFICE NOTE  01/01/2014  CC:  Chief Complaint  Patient presents with  . Headache    x 4 days     HPI: Patient is a 38 y.o.  male who is here for headaches. Onset 4 d/a left periorbital/left eyebrow region, constant but intensity waxes and wanes. Tylenol helps some.  Sumatriptan helps most instances but HA recurrs.  No vision changes w/HAs or before. A bit of left eye tearing but no eye drooping.  No nausea, no light or noise sensitivity. These are similar to his HAs in the past but last bout of these about a year ago were around right eye.  Chronic rhinitis but no acute changes recently. Nonsmoker.  No alcohol in the last 4-5 d. No new foods or drinks.  No recent ST/UROI/Fever. ROS: restless legs daytime but not night time.  Excessive leg movement during sleep. No hx of RLS med tx.  Pertinent PMH:  Past medical, surgical, social hx reviewed.  MEDS: MVI, imitrex, allegra, tylenol, ibuprofen   PE: Blood pressure 113/78, pulse 92, temperature 98.4 F (36.9 C), temperature source Temporal, resp. rate 18, height 6\' 2"  (1.88 m), weight 217 lb (98.431 kg), SpO2 96.00%. Gen: Alert, well appearing.  Patient is oriented to person, place, time, and situation. YQM:VHQIENT:Eyes: no injection, icteris, swelling, or exudate.  EOMI, PERRLA.  No tenderness around TA or frontal sinus or orbits.  No eyelid droop or tearing. Mouth: lips without lesion/swelling.  Oral mucosa pink and moist. Oropharynx without erythema, exudate, or swelling.  CV: RRR, no m/r/g.   LUNGS: CTA bilat, nonlabored resps, good aeration in all lung fields. Neuro: CN 2-12 intact bilaterally, strength 5/5 in proximal and distal upper extremities and lower extremities bilaterally.  No sensory deficits.  No tremor.  No disdiadochokinesis.  No ataxia.  Upper extremity and lower extremity DTRs symmetric.  No pronator drift.   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 I attempted to rx  frovatriptan in order to get longer duration of HA resolution but this was cost prohibitive so I switched to a trial of generic maxalt 10mg  q2h prn up to 3 doses in a row max.  Therapeutic expectations and side effect profile of medication discussed today.  Patient's questions answered. Also, restart nortriptyline 10mg  qhs, titrate up to 20mg  qhs in 5-7d.  Watch for worsening of RLS on nortriptyline.  RLS: no treatment at this time.  Of note, Hb normal in 2014. An After Visit Summary was printed and given to the patient.  If no improvement with current therapies then I recommend pt get neuro eval to see if they feel he may have cluster HA's (vs therapeutic/diagnostic trial of oxygen for acute HA in our office in future.).  FOLLOW UP: 1 month

## 2014-01-04 ENCOUNTER — Encounter: Payer: Self-pay | Admitting: Family Medicine

## 2014-01-29 ENCOUNTER — Ambulatory Visit: Payer: BC Managed Care – PPO | Admitting: Family Medicine

## 2014-02-08 ENCOUNTER — Ambulatory Visit: Payer: BC Managed Care – PPO | Admitting: Family Medicine

## 2014-02-26 ENCOUNTER — Ambulatory Visit: Payer: BC Managed Care – PPO | Admitting: Family Medicine

## 2014-03-08 ENCOUNTER — Ambulatory Visit: Payer: BC Managed Care – PPO | Admitting: Family Medicine

## 2014-03-12 ENCOUNTER — Encounter: Payer: Self-pay | Admitting: Family Medicine

## 2014-03-12 ENCOUNTER — Ambulatory Visit (INDEPENDENT_AMBULATORY_CARE_PROVIDER_SITE_OTHER): Payer: BC Managed Care – PPO | Admitting: Family Medicine

## 2014-03-12 VITALS — BP 119/80 | HR 83 | Temp 97.8°F | Resp 18 | Ht 74.0 in | Wt 207.0 lb

## 2014-03-12 DIAGNOSIS — R5383 Other fatigue: Secondary | ICD-10-CM

## 2014-03-12 DIAGNOSIS — G2581 Restless legs syndrome: Secondary | ICD-10-CM

## 2014-03-12 DIAGNOSIS — G43909 Migraine, unspecified, not intractable, without status migrainosus: Secondary | ICD-10-CM

## 2014-03-12 DIAGNOSIS — G43109 Migraine with aura, not intractable, without status migrainosus: Secondary | ICD-10-CM

## 2014-03-12 DIAGNOSIS — R5381 Other malaise: Secondary | ICD-10-CM

## 2014-03-12 MED ORDER — PRAMIPEXOLE DIHYDROCHLORIDE 0.125 MG PO TABS: ORAL_TABLET | ORAL | Status: DC

## 2014-03-12 NOTE — Assessment & Plan Note (Signed)
Time to start treatment. Will be checking Hb at upcoming CPE. Start pramipexole at 0.125mg  qhs. May increase pramipexole by 1 tab every 5 days to a max of 4 tabs every night.

## 2014-03-12 NOTE — Progress Notes (Signed)
Pre visit review using our clinic review tool, if applicable. No additional management support is needed unless otherwise documented below in the visit note. 

## 2014-03-12 NOTE — Patient Instructions (Signed)
May increase pramipexole by 1 tab every 5 days to a max of 4 tabs every night.

## 2014-03-12 NOTE — Assessment & Plan Note (Signed)
Improved with nightly nortriptyline 10mg . He is tolerating the vasomotor side effects of this med for now.

## 2014-03-12 NOTE — Progress Notes (Signed)
OFFICE NOTE  03/12/2014  CC:  Chief Complaint  Patient presents with  . Follow-up   HPI: Patient is a 38 y.o. Caucasian male who is here for 2 mo f/u headaches.  Last visit I rx'd trial of maxalt and also restarted his pamelor with instructions to titrate up to 20mg  qhs. He has kept his dose at 10mg  nightly and finds that it has helped a lot: no severe HA's since starting it.  Doesn't wake up with HA's anymore.  Has occ tension HAs still. Having RLS worse and entire body restless more when he tries to go down to sleep.  Has some vasomotor side effects daily since starting pamelor. Has not had to take a triptan since last visit.  ROS: some fatigue  Pertinent PMH:  Past Medical History  Diagnosis Date  . Headache(784.0) 02/27/2010    Long history of HA's.  CT head neg 02/2013.  CT sinuses neg 2014.  . LOW BACK PAIN 08/15/2009  . TOBACCO USE, QUIT 08/15/2009  . Environmental allergies    Past Surgical History  Procedure Laterality Date  . Nasal sinus surgery  2014   History   Social History Narrative   Married, no children.   Manager at [employer].   Regular exercise, yes.   5 pack-yr smoking hx; quit about 2005.  Alc: 2 beers a day, no wine or hard liquor.            MEDS:  Nortriptyline 10mg  qhs, imitrix OR maxalt prn, allegra qd prn  PE: Blood pressure 119/80, pulse 83, temperature 97.8 F (36.6 C), temperature source Temporal, resp. rate 18, height 6\' 2"  (1.88 m), weight 207 lb (93.895 kg), SpO2 96.00%. Gen: Alert, well appearing.  Patient is oriented to person, place, time, and situation. AFFECT: pleasant, lucid thought and speech. No further exam today.  IMPRESSION AND PLAN:  Restless leg syndrome Time to start treatment. Will be checking Hb at upcoming CPE. Start pramipexole at 0.125mg  qhs. May increase pramipexole by 1 tab every 5 days to a max of 4 tabs every night.   Migraine syndrome Improved with nightly nortriptyline 10mg . He is  tolerating the vasomotor side effects of this med for now.  Other malaise and fatigue Will discuss more in depth at his next CPE that he'll schedule soon.   An After Visit Summary was printed and given to the patient.  FOLLOW UP: CPE at his convenience

## 2014-03-12 NOTE — Assessment & Plan Note (Signed)
Will discuss more in depth at his next CPE that he'll schedule soon.

## 2014-03-30 ENCOUNTER — Encounter: Payer: Self-pay | Admitting: Family Medicine

## 2014-03-30 ENCOUNTER — Ambulatory Visit (INDEPENDENT_AMBULATORY_CARE_PROVIDER_SITE_OTHER): Payer: BC Managed Care – PPO | Admitting: Family Medicine

## 2014-03-30 VITALS — BP 113/81 | HR 72 | Temp 97.2°F | Resp 18 | Ht 74.0 in | Wt 211.0 lb

## 2014-03-30 DIAGNOSIS — G2581 Restless legs syndrome: Secondary | ICD-10-CM

## 2014-03-30 DIAGNOSIS — R7401 Elevation of levels of liver transaminase levels: Secondary | ICD-10-CM

## 2014-03-30 DIAGNOSIS — R7989 Other specified abnormal findings of blood chemistry: Secondary | ICD-10-CM

## 2014-03-30 DIAGNOSIS — E782 Mixed hyperlipidemia: Secondary | ICD-10-CM

## 2014-03-30 DIAGNOSIS — R740 Nonspecific elevation of levels of transaminase and lactic acid dehydrogenase [LDH]: Secondary | ICD-10-CM

## 2014-03-30 DIAGNOSIS — R946 Abnormal results of thyroid function studies: Secondary | ICD-10-CM

## 2014-03-30 LAB — T4, FREE: Free T4: 0.63 ng/dL (ref 0.60–1.60)

## 2014-03-30 LAB — LIPID PANEL
CHOL/HDL RATIO: 5
Cholesterol: 243 mg/dL — ABNORMAL HIGH (ref 0–200)
HDL: 52.2 mg/dL (ref >39.00)
LDL Cholesterol: 148 mg/dL — ABNORMAL HIGH (ref 0–99)
Triglycerides: 213 mg/dL — ABNORMAL HIGH (ref 0.0–149.0)
VLDL: 42.6 mg/dL — ABNORMAL HIGH (ref 0.0–40.0)

## 2014-03-30 LAB — ALT: ALT: 15 U/L (ref 0–53)

## 2014-03-30 LAB — AST: AST: 13 U/L (ref 0–37)

## 2014-03-30 LAB — TSH: TSH: 1.17 u[IU]/mL (ref 0.35–4.50)

## 2014-03-30 NOTE — Progress Notes (Signed)
Pre visit review using our clinic review tool, if applicable. No additional management support is needed unless otherwise documented below in the visit note. 

## 2014-03-30 NOTE — Progress Notes (Signed)
OFFICE NOTE  03/30/2014  CC:  Chief Complaint  Patient presents with  . Annual Exam    fasting     HPI: Patient is a 38 y.o. Caucasian male who is here for 3 wk recheck RLS (originally scheduled for CPE today but we realized it is too early for this, the last CPE being 07/2013).  Symptoms well controlled on one mirapex 0.125mg  tab qhs.  Has some daytime RLS sx's, and asks if he can take a dose of the med during the day.  I told him yes--he could take a tab up to tid (max total daily dose of 0.5mg ). HAs still well controlled on 1 10mg  tab of pamelor.  No triptans needed.  He has hx of mild cholesterol abnormality (elevated trigs), also hx of mild elevation of ALT and AST, hx of low TSH. Recheck of these labs 07/2013 were normal except cholesterol.  He is not on cholesterol med.  Pertinent PMH:  Past medical, surgical, social, and family history reviewed and no changes are noted since last office visit.  MEDS:  Outpatient Prescriptions Prior to Visit  Medication Sig Dispense Refill  . fexofenadine (ALLEGRA) 180 MG tablet Take 180 mg by mouth daily.      . Multiple Vitamin (MULTIVITAMIN WITH MINERALS) TABS Take 1 tablet by mouth daily.      Marland Kitchen. PAMELOR 10 MG capsule Take 1-2 capsules (10-20 mg total) by mouth at bedtime.  60 capsule  5  . pramipexole (MIRAPEX) 0.125 MG tablet 1-4 tabs po 1-3 hours prior to sleep  120 tablet  1   No facility-administered medications prior to visit.    PE: Blood pressure 113/81, pulse 72, temperature 97.2 F (36.2 C), temperature source Temporal, resp. rate 18, height 6\' 2"  (1.88 m), weight 211 lb (95.709 kg), SpO2 98.00%. Gen: Alert, well appearing.  Patient is oriented to person, place, time, and situation. AFFECT: pleasant, lucid thought and speech. No further exam today.  LABS:  Lab Results  Component Value Date   CHOL 195 08/02/2013   HDL 49.10 08/02/2013   LDLCALC 123* 08/15/2009   LDLDIRECT 107.7 08/02/2013   TRIG 240.0* 08/02/2013   CHOLHDL 4  08/02/2013     Chemistry      Component Value Date/Time   NA 138 08/02/2013 1433   K 4.5 08/02/2013 1433   CL 100 08/02/2013 1433   CO2 32 08/02/2013 1433   BUN 13 08/02/2013 1433   CREATININE 1.1 08/02/2013 1433      Component Value Date/Time   CALCIUM 9.4 08/02/2013 1433   ALKPHOS 79 08/02/2013 1433   AST 15 08/02/2013 1433   ALT 16 08/02/2013 1433   BILITOT 0.3 08/02/2013 1433     Lab Results  Component Value Date   TSH 0.86 08/02/2013    IMPRESSION AND PLAN:  1) RLS; The current medical regimen is effective;  continue present plan and medications. May add 0.125mg  dose in daytime (take 1 tab up to tid).  2) Hx of high trigs: repeat FLP today.  3) Hx of elevated transaminase: repeat AST and ALT today.  4) Hx of low TSH (repeat was borderline low on f/u testing 4 mo later): recheck TSH today and also check free T4 and T3 total.  An After Visit Summary was printed and given to the patient.  FOLLOW UP: 4 mo for CPE

## 2014-03-31 LAB — T3: T3, Total: 73.1 ng/dL — ABNORMAL LOW (ref 80.0–204.0)

## 2014-04-09 ENCOUNTER — Telehealth: Payer: Self-pay

## 2014-04-09 NOTE — Telephone Encounter (Signed)
His T3 is not so low that it would cause any problems like this.  Reassure him that I'm taking everything into consideration when I interpret his labs. -thx

## 2014-04-09 NOTE — Telephone Encounter (Signed)
Pt had a question about his T3 lab result. He saw his results online and sees that it is out of range. He states he also read online that if your T3 is out of range this can cause the cholesterol in your blood to be higher and cause fatigue. Please advise.

## 2014-04-10 NOTE — Telephone Encounter (Signed)
Spoke with pt, advised message from Dr McGowen. Pt understood. 

## 2014-06-13 ENCOUNTER — Encounter: Payer: BC Managed Care – PPO | Admitting: Family Medicine

## 2014-06-15 ENCOUNTER — Encounter: Payer: BC Managed Care – PPO | Admitting: Family Medicine

## 2014-06-18 ENCOUNTER — Encounter: Payer: Self-pay | Admitting: Family Medicine

## 2014-06-18 ENCOUNTER — Ambulatory Visit (INDEPENDENT_AMBULATORY_CARE_PROVIDER_SITE_OTHER): Payer: BC Managed Care – PPO | Admitting: Family Medicine

## 2014-06-18 VITALS — BP 115/80 | HR 76 | Temp 97.4°F | Resp 16 | Ht 74.0 in | Wt 211.0 lb

## 2014-06-18 DIAGNOSIS — G43909 Migraine, unspecified, not intractable, without status migrainosus: Secondary | ICD-10-CM

## 2014-06-18 DIAGNOSIS — M7700 Medial epicondylitis, unspecified elbow: Secondary | ICD-10-CM

## 2014-06-18 DIAGNOSIS — Z0000 Encounter for general adult medical examination without abnormal findings: Secondary | ICD-10-CM

## 2014-06-18 DIAGNOSIS — G43109 Migraine with aura, not intractable, without status migrainosus: Secondary | ICD-10-CM

## 2014-06-18 DIAGNOSIS — M7702 Medial epicondylitis, left elbow: Secondary | ICD-10-CM | POA: Insufficient documentation

## 2014-06-18 LAB — CBC WITH DIFFERENTIAL/PLATELET
BASOS PCT: 0.4 % (ref 0.0–3.0)
Basophils Absolute: 0 10*3/uL (ref 0.0–0.1)
EOS ABS: 0.2 10*3/uL (ref 0.0–0.7)
Eosinophils Relative: 2.3 % (ref 0.0–5.0)
HCT: 41.2 % (ref 39.0–52.0)
HEMOGLOBIN: 14 g/dL (ref 13.0–17.0)
Lymphocytes Relative: 36.6 % (ref 12.0–46.0)
Lymphs Abs: 2.8 10*3/uL (ref 0.7–4.0)
MCHC: 34 g/dL (ref 30.0–36.0)
MCV: 90.4 fl (ref 78.0–100.0)
MONO ABS: 0.5 10*3/uL (ref 0.1–1.0)
Monocytes Relative: 6.7 % (ref 3.0–12.0)
NEUTROS ABS: 4.2 10*3/uL (ref 1.4–7.7)
NEUTROS PCT: 54 % (ref 43.0–77.0)
Platelets: 276 10*3/uL (ref 150.0–400.0)
RBC: 4.56 Mil/uL (ref 4.22–5.81)
RDW: 14.5 % (ref 11.5–15.5)
WBC: 7.7 10*3/uL (ref 4.0–10.5)

## 2014-06-18 LAB — COMPREHENSIVE METABOLIC PANEL
ALBUMIN: 4.3 g/dL (ref 3.5–5.2)
ALK PHOS: 88 U/L (ref 39–117)
ALT: 18 U/L (ref 0–53)
AST: 16 U/L (ref 0–37)
BUN: 17 mg/dL (ref 6–23)
CALCIUM: 9.5 mg/dL (ref 8.4–10.5)
CHLORIDE: 101 meq/L (ref 96–112)
CO2: 27 mEq/L (ref 19–32)
Creatinine, Ser: 1 mg/dL (ref 0.4–1.5)
GFR: 85.19 mL/min (ref >60.00)
Glucose, Bld: 88 mg/dL (ref 70–99)
Potassium: 4.1 mEq/L (ref 3.5–5.1)
Sodium: 138 mEq/L (ref 135–145)
Total Bilirubin: 0.6 mg/dL (ref 0.2–1.2)
Total Protein: 7.2 g/dL (ref 6.0–8.3)

## 2014-06-18 MED ORDER — HYDROCODONE-ACETAMINOPHEN 5-325 MG PO TABS: 1.0000 | ORAL_TABLET | Freq: Four times a day (QID) | ORAL | Status: DC | PRN

## 2014-06-18 NOTE — Patient Instructions (Signed)
OTC ibuprofen 3 tabs twice a day with food.

## 2014-06-18 NOTE — Assessment & Plan Note (Signed)
Reviewed age and gender appropriate health maintenance issues (prudent diet, regular exercise, health risks of tobacco and excessive alcohol, use of seatbelts, fire alarms in home, use of sunscreen).  Also reviewed age and gender appropriate health screening as well as vaccine recommendations. Has had thyroid testing and lipids within the las 3 mo so no repeat of these needed today but I'll do CBC and CMET.

## 2014-06-18 NOTE — Assessment & Plan Note (Signed)
Doing well/having very infrequent HA. However, when he does have one it is severe and 2 different triptans have not helped. I am ok with use of vicodin for these HA's as long as they are very infrequent. He expressed understanding of this and we decided to give it a trial: vicodin 5/325, 1-2 q6h prn HA, #20, no RF.  Therapeutic expectations and side effect profile of medication discussed today.  Patient's questions answered.

## 2014-06-18 NOTE — Progress Notes (Signed)
Pre visit review using our clinic review tool, if applicable. No additional management support is needed unless otherwise documented below in the visit note. 

## 2014-06-18 NOTE — Progress Notes (Signed)
Office Note 06/18/2014  CC:  Chief Complaint  Patient presents with  . Annual Exam   HPI:  Dylan Pope is a 38 y.o. White male who is here for CPE.  Also with elbow complaint. Having medial left elbow discomfort for couple months, worse with flexion of fingers, lifting, shutting doors.   Using a strap over the antecubital fossa but not in correct region for golfer's elbow.  Migraines: only two HAs over the last 3-4 mo, compliant with his pamelor.   Entire head involved, throbbing, no aura.  Imitrex not helpful. Frova not helpful in the past either.  He had a vicodin leftover from a prev surgery and took 2 and this helped relieve the HA completely on the most recent occasion of HA about 2-3 wks ago.    Past Medical History  Diagnosis Date  . Headache(784.0) 02/27/2010    Long history of HA's.  CT head neg 02/2013.  CT sinuses neg 2014.  . LOW BACK PAIN 08/15/2009  . TOBACCO USE, QUIT 08/15/2009  . Environmental allergies     Past Surgical History  Procedure Laterality Date  . Nasal sinus surgery  2014    Family History  Problem Relation Age of Onset  . Hypertension Other   . Hyperlipidemia Other   . Hypertension Father   . Hyperlipidemia Father     History   Social History  . Marital Status: Married    Spouse Name: N/A    Number of Children: N/A  . Years of Education: N/A   Occupational History  . [employer] designer, theatre/television/filmmanager    Social History Main Topics  . Smoking status: Former Games developermoker  . Smokeless tobacco: Never Used  . Alcohol Use: Yes     Comment: occasional use  . Drug Use: No  . Sexual Activity: Yes   Other Topics Concern  . Not on file   Social History Narrative   Married, no children.   Manager at [employer].   Regular exercise, yes.   5 pack-yr smoking hx; quit about 2005.  Alc: 2 beers a day, no wine or hard liquor.            Outpatient Prescriptions Prior to Visit  Medication Sig Dispense Refill  . fexofenadine (ALLEGRA) 180 MG tablet  Take 180 mg by mouth daily.      . Multiple Vitamin (MULTIVITAMIN WITH MINERALS) TABS Take 1 tablet by mouth daily.      Marland Kitchen. PAMELOR 10 MG capsule Take 1-2 capsules (10-20 mg total) by mouth at bedtime.  60 capsule  5  . pramipexole (MIRAPEX) 0.125 MG tablet 1-4 tabs po 1-3 hours prior to sleep  120 tablet  1   No facility-administered medications prior to visit.    No Known Allergies  ROS Review of Systems  Constitutional: Negative for fever, chills, appetite change and fatigue.  HENT: Negative for congestion, dental problem, ear pain and sore throat.   Eyes: Negative for discharge, redness and visual disturbance.  Respiratory: Negative for cough, chest tightness, shortness of breath and wheezing.   Cardiovascular: Negative for chest pain, palpitations and leg swelling.  Gastrointestinal: Negative for nausea, vomiting, abdominal pain, diarrhea and blood in stool.  Genitourinary: Negative for dysuria, urgency, frequency, hematuria, flank pain and difficulty urinating.  Musculoskeletal: Positive for arthralgias (left elbow as per HPI). Negative for back pain, joint swelling, myalgias and neck stiffness.  Skin: Negative for pallor and rash.  Neurological: Positive for headaches (as per HPI). Negative for dizziness, speech  difficulty and weakness.  Hematological: Negative for adenopathy. Does not bruise/bleed easily.  Psychiatric/Behavioral: Negative for confusion and sleep disturbance. The patient is not nervous/anxious.     PE; Blood pressure 115/80, pulse 76, temperature 97.4 F (36.3 C), temperature source Temporal, resp. rate 16, height 6\' 2"  (1.88 m), weight 211 lb (95.709 kg), SpO2 97.00%. Gen: Alert, well appearing.  Patient is oriented to person, place, time, and situation. AFFECT: pleasant, lucid thought and speech. ENT: Ears: EACs clear, normal epithelium.  TMs with good light reflex and landmarks bilaterally.  Eyes: no injection, icteris, swelling, or exudate.  EOMI,  PERRLA. Nose: no drainage or turbinate edema/swelling.  No injection or focal lesion.  Mouth: lips without lesion/swelling.  Oral mucosa pink and moist.  Dentition intact and without obvious caries or gingival swelling.  Oropharynx without erythema, exudate, or swelling.  Neck: supple/nontender.  No LAD, mass, or TM.  Carotid pulses 2+ bilaterally, without bruits. CV: RRR, no m/r/g.   LUNGS: CTA bilat, nonlabored resps, good aeration in all lung fields. ABD: soft, NT, ND, BS normal.  No hepatospenomegaly or mass.  No bruits. EXT: no clubbing, cyanosis, or edema.  Musculoskeletal: no joint swelling, erythema, warmth, or tenderness.  ROM of all joints intact. Skin - no sores or suspicious lesions or rashes or color changes   Pertinent labs:  Lab Results  Component Value Date   CHOL 243* 03/30/2014   HDL 52.20 03/30/2014   LDLCALC 148* 03/30/2014   LDLDIRECT 107.7 08/02/2013   TRIG 213.0* 03/30/2014   CHOLHDL 5 03/30/2014   Lab Results  Component Value Date   TSH 1.17 03/30/2014   T3TOTAL 73.1* 03/30/2014  T4 03/30/14  = 0.63 (normal)    Chemistry      Component Value Date/Time   NA 138 08/02/2013 1433   K 4.5 08/02/2013 1433   CL 100 08/02/2013 1433   CO2 32 08/02/2013 1433   BUN 13 08/02/2013 1433   CREATININE 1.1 08/02/2013 1433      Component Value Date/Time   CALCIUM 9.4 08/02/2013 1433   ALKPHOS 79 08/02/2013 1433   AST 13 03/30/2014 0847   ALT 15 03/30/2014 0847   BILITOT 0.3 08/02/2013 1433     Lab Results  Component Value Date   WBC 8.3 08/02/2013   HGB 15.1 08/02/2013   HCT 43.8 08/02/2013   MCV 89.5 08/02/2013   PLT 268.0 08/02/2013     ASSESSMENT AND PLAN:   Migraine syndrome Doing well/having very infrequent HA. However, when he does have one it is severe and 2 different triptans have not helped. I am ok with use of vicodin for these HA's as long as they are very infrequent. He expressed understanding of this and we decided to give it a trial: vicodin 5/325, 1-2 q6h prn  HA, #20, no RF.  Therapeutic expectations and side effect profile of medication discussed today.  Patient's questions answered.   Medial epicondylitis of left elbow Fitted pt with tennis elbow strap and put it on him the correct way, recommended ibup 600mg  bid x 10d.  Health maintenance examination Reviewed age and gender appropriate health maintenance issues (prudent diet, regular exercise, health risks of tobacco and excessive alcohol, use of seatbelts, fire alarms in home, use of sunscreen).  Also reviewed age and gender appropriate health screening as well as vaccine recommendations. Has had thyroid testing and lipids within the las 3 mo so no repeat of these needed today but I'll do CBC and CMET.   An  After Visit Summary was printed and given to the patient.  FOLLOW UP:  Return in about 6 months (around 12/19/2014) for f/u HA's.

## 2014-06-18 NOTE — Assessment & Plan Note (Signed)
Fitted pt with tennis elbow strap and put it on him the correct way, recommended ibup 600mg  bid x 10d.

## 2014-06-22 ENCOUNTER — Encounter: Payer: BC Managed Care – PPO | Admitting: Family Medicine

## 2014-07-03 ENCOUNTER — Ambulatory Visit (INDEPENDENT_AMBULATORY_CARE_PROVIDER_SITE_OTHER): Payer: BC Managed Care – PPO | Admitting: Family Medicine

## 2014-07-03 ENCOUNTER — Encounter: Payer: Self-pay | Admitting: Family Medicine

## 2014-07-03 VITALS — BP 114/82 | HR 67 | Temp 97.3°F | Resp 16 | Ht 74.0 in | Wt 210.0 lb

## 2014-07-03 DIAGNOSIS — B009 Herpesviral infection, unspecified: Secondary | ICD-10-CM

## 2014-07-03 DIAGNOSIS — B001 Herpesviral vesicular dermatitis: Secondary | ICD-10-CM | POA: Insufficient documentation

## 2014-07-03 MED ORDER — VALACYCLOVIR HCL 1 G PO TABS: ORAL_TABLET | ORAL | Status: DC

## 2014-07-03 NOTE — Progress Notes (Signed)
OFFICE NOTE  07/03/2014  CC:  Chief Complaint  Patient presents with  . Mouth Lesions    x Sunday gradually worsening. ( possible sunburn )    HPI: Patient is a 38 y.o. Caucasian male who is here for dry, blistery lips. Onset about 5 d/a while camping, not protecting lips from sun.  Gradually getting more and more as time goes on, they are moderately painful.  All are on upper lip right now. These occurrences are getting more frequent, this year he's had 3-4 episodes. He has not ever sought medical help for this in the past, no rx for oral antiviral in the past.  Has been using OTC abreva.  Pertinent PMH:  Past medical, surgical, social, and family history reviewed and no changes are noted since last office visit.  MEDS:  Outpatient Prescriptions Prior to Visit  Medication Sig Dispense Refill  . fexofenadine (ALLEGRA) 180 MG tablet Take 180 mg by mouth daily.      Marland Kitchen HYDROcodone-acetaminophen (NORCO/VICODIN) 5-325 MG per tablet Take 1-2 tablets by mouth every 6 (six) hours as needed for moderate pain.  20 tablet  0  . Multiple Vitamin (MULTIVITAMIN WITH MINERALS) TABS Take 1 tablet by mouth daily.      Marland Kitchen PAMELOR 10 MG capsule Take 1-2 capsules (10-20 mg total) by mouth at bedtime.  60 capsule  5  . pramipexole (MIRAPEX) 0.125 MG tablet 1-4 tabs po 1-3 hours prior to sleep  120 tablet  1   No facility-administered medications prior to visit.    PE: Blood pressure 114/82, pulse 67, temperature 97.3 F (36.3 C), temperature source Temporal, resp. rate 16, height  (1.88 m), weight 210 lb (95.255 kg), SpO2 96.00%. Gen: Alert, well appearing.  Patient is oriented to person, place, time, and situation. Lips: upper lip with 3 small vesicular lesions in midline, one of which is opened and dry.  Remainder of lip surface normal.  Gingiva and buccal mucosa normal.  Tongue and pharynx normal.  Dentition normal.  IMPRESSION AND PLAN:  Herpes labialis, episodes getting more  frequent. Valtrex 2g q12h x 2 doses, #20, RF x 1. He'll hold off on daily suppressive therapy for now and he'll gauge his use of the prn treatment over the next 6 mo to further try to decide on suppressive therapy or not (would use valtrex  qd).  An After Visit Summary was printed and given to the patient.  FOLLOW UP: prn

## 2014-07-03 NOTE — Progress Notes (Signed)
Pre visit review using our clinic review tool, if applicable. No additional management support is needed unless otherwise documented below in the visit note. 

## 2014-07-25 ENCOUNTER — Encounter: Payer: BC Managed Care – PPO | Admitting: Family Medicine

## 2014-08-13 ENCOUNTER — Ambulatory Visit (INDEPENDENT_AMBULATORY_CARE_PROVIDER_SITE_OTHER): Payer: BC Managed Care – PPO | Admitting: Family Medicine

## 2014-08-13 ENCOUNTER — Encounter: Payer: Self-pay | Admitting: Family Medicine

## 2014-08-13 VITALS — BP 120/85 | HR 66 | Temp 98.2°F | Resp 18 | Ht 74.0 in | Wt 210.0 lb

## 2014-08-13 DIAGNOSIS — G44011 Episodic cluster headache, intractable: Secondary | ICD-10-CM

## 2014-08-13 NOTE — Progress Notes (Signed)
Pre visit review using our clinic review tool, if applicable. No additional management support is needed unless otherwise documented below in the visit note. 

## 2014-08-13 NOTE — Progress Notes (Signed)
OFFICE NOTE  08/13/2014  CC:  Chief Complaint  Patient presents with  . Headache    x Saturday night, progressively worsening.      HPI: Patient is a 38 y.o. Caucasian male who is here for headache.  Onset about 2 days ago--says it is like his typical HA and describes it like this: R eye tearing, unilateral R sided orbital pain, feeling of needing to squint R eye.  Was in bed all day yesterday. Rizatriptan no help x 2 doses, tylenol not helping, ibuprofen.   Most recent med taken was 1000 mg tylenol this morning about 6 hours ago.   Used vicodin that I rx'd him back in August for the first time on one of these recent HAs and it helped but then when the med wore off he feels like HA came back worse.   Has had 3 bad HAs in the last 10d or so. Currently has HA in area of right orbit, 5-6/10 intensity, no nausea, photophobia, or phonophobia.  Pertinent PMH:  Past medical, surgical, social, and family history reviewed and no changes are noted since last office visit.  MEDS:  Outpatient Prescriptions Prior to Visit  Medication Sig Dispense Refill  . fexofenadine (ALLEGRA) 180 MG tablet Take 180 mg by mouth daily.      Marland Kitchen. HYDROcodone-acetaminophen (NORCO/VICODIN) 5-325 MG per tablet Take 1-2 tablets by mouth every 6 (six) hours as needed for moderate pain.  20 tablet  0  . Multiple Vitamin (MULTIVITAMIN WITH MINERALS) TABS Take 1 tablet by mouth daily.      Marland Kitchen. PAMELOR 10 MG capsule Take 1-2 capsules (10-20 mg total) by mouth at bedtime.  60 capsule  5  . valACYclovir (VALTREX) 1000 MG tablet 2 tabs po q12h x 2 doses for acute outbreak of sores on lips  20 tablet  1  . pramipexole (MIRAPEX) 0.125 MG tablet 1-4 tabs po 1-3 hours prior to sleep  120 tablet  1   No facility-administered medications prior to visit.    PE: Blood pressure 120/85, pulse 66, temperature 98.2 F (36.8 C), temperature source Temporal, resp. rate 18, height 6\' 2"  (1.88 m), weight 210 lb (95.255 kg), SpO2  96.00%. Gen: Alert, well appearing.  Patient is oriented to person, place, time, and situation. ENT: Ears: EACs clear, normal epithelium.  TMs with good light reflex and landmarks bilaterally.  Eyes: no injection, icteris, swelling, or exudate.  EOMI, PERRLA.  No ptosis.  No horner syndrome. Nose: no drainage or turbinate edema/swelling.  No injection or focal lesion.  Mouth: lips without lesion/swelling.  Oral mucosa pink and moist.  Dentition intact and without obvious caries or gingival swelling.  Oropharynx without erythema, exudate, or swelling.  Neuro: CN 2-12 intact bilaterally, strength 5/5 in proximal and distal upper extremities and lower extremities bilaterally.  No tremor.  No disdiadochokinesis.  No ataxia. No pronator drift.   IMPRESSION AND PLAN:  Cluster headaches. I tried about 10 min of 6L oxygen via nasal cannulae today and he really didn't feel any benefit. I recommended he continue to try prn triptan OR vicodin at home, will ask neuro to see. No new rx's today.  An After Visit Summary was printed and given to the patient.  FOLLOW UP: prn

## 2014-08-14 ENCOUNTER — Ambulatory Visit (INDEPENDENT_AMBULATORY_CARE_PROVIDER_SITE_OTHER): Payer: BC Managed Care – PPO | Admitting: Neurology

## 2014-08-14 ENCOUNTER — Telehealth: Payer: Self-pay | Admitting: Neurology

## 2014-08-14 ENCOUNTER — Encounter: Payer: Self-pay | Admitting: Neurology

## 2014-08-14 VITALS — BP 110/82 | HR 75 | Resp 18 | Ht 75.0 in | Wt 211.0 lb

## 2014-08-14 DIAGNOSIS — G44019 Episodic cluster headache, not intractable: Secondary | ICD-10-CM

## 2014-08-14 MED ORDER — PREDNISONE 20 MG PO TABS: ORAL_TABLET | ORAL | Status: DC

## 2014-08-14 MED ORDER — VERAPAMIL HCL 40 MG PO TABS: ORAL_TABLET | ORAL | Status: DC

## 2014-08-14 MED ORDER — SUMATRIPTAN SUCCINATE 6 MG/0.5ML ~~LOC~~ SOLN: 6.0000 mg | SUBCUTANEOUS | Status: DC | PRN

## 2014-08-14 NOTE — Patient Instructions (Addendum)
1. Start Verapamil 40mg : Take 1 tab daily for 1 week, then increase to 2 tabs daily 2. Take Prednisone 20mg  tabs: Take 3 tablets daily for 5 days, then on day 6 take 2.5 tabs, day 7 take 2 tabs, day 8 take 1.5 tabs, day 9 take 1 tab, day 10 take 1/2 tab, then stop. 3. May take Imitrex injection at onset of headache or if you wake up with headache. Do not use more than 3 times a week 4. Follow-up in 1 month, call our office for any problems

## 2014-08-14 NOTE — Telephone Encounter (Signed)
Pt needs to found out some medication please call [phone]

## 2014-08-14 NOTE — Progress Notes (Signed)
NEUROLOGY CONSULTATION NOTE  Dylan Pope MRN: [id number] DOB: [birth year]  Referring provider: Dr. Nicoletta BaPhilip Pope Primary care provider: Dr. Nicoletta BaPhilip Pope  Reason for consult:  Cluster headaches  Dear Dylan Pope:  [facility] the above symptoms. Although his history is well known to you, please allow me to reiterate it for the purpose of our medical record. Records and images were personally reviewed where available.  HISTORY OF PRESENT ILLNESS: This is a pleasant 38 year old right-handed man presenting for evaluation of recurrent headaches. He reports that he started having headaches in his 20s lasting several days over the right periorbital region radiating behind his eye and to the parietal region that he had attributed to sinus infections, treated with antibiotics for 10 days. During the Spring of 2014, he had a more intense headache around his right eye, radiating behind his eye to the frontal, temporal regions, down his jaw, and to the top of his head. This was associated with tearing of the right eye and conjunctival injection. The headache lasted for 14 days. He had seen an ENT specialist at that time and was told there was no evidence of sinus disease.  He again had similar symptoms for the past 3 days. He woke up at 3am this morning with "13 over 10" pain and took 2 Vicodin. Imitrex is hit or miss with effectiveness. He states he has a 3/10 headache at this time.  He denies any photo or phonophobia, some nausea due to lack of appetite from the pain. He denies any focal numbness/tingling/weakness. He has neck pain. He feels he cannot lie on his right side as this worsens the pain. No diplopia, dysarthria, dysphagia, bowel/bladder dysfunction. He feels tired all the time, stating that his sleep schedule depends on his work schedule, no change in the past 10-11 years. He denies any recent infections or head injuries. His mother had  migraines. He has been taking low dose nortriptyline 10mg  qhs for the past year.    I personally reviewed head CT without contrast done 02/2013 for similar headaches, it is normal.  Laboratory Data: Lab Results  Component Value Date   WBC 7.7 06/18/2014   HGB 14.0 06/18/2014   HCT 41.2 06/18/2014   MCV 90.4 06/18/2014   PLT 276.0 06/18/2014     Chemistry      Component Value Date/Time   NA 138 06/18/2014 1015   K 4.1 06/18/2014 1015   CL 101 06/18/2014 1015   CO2 27 06/18/2014 1015   BUN 17 06/18/2014 1015   CREATININE 1.0 06/18/2014 1015      Component Value Date/Time   CALCIUM 9.5 06/18/2014 1015   ALKPHOS 88 06/18/2014 1015   AST 16 06/18/2014 1015   ALT 18 06/18/2014 1015   BILITOT 0.6 06/18/2014 1015        PAST MEDICAL HISTORY: Past Medical History  Diagnosis Date  . Headache(784.0) 02/27/2010    Long history of HA's.  CT head neg 02/2013.  CT sinuses neg 2014.  . LOW BACK PAIN 08/15/2009  . TOBACCO USE, QUIT 08/15/2009  . Environmental allergies     PAST SURGICAL HISTORY: Past Surgical History  Procedure Laterality Date  . Nasal sinus surgery  2014    MEDICATIONS: Current Outpatient Prescriptions on File Prior to Visit  Medication Sig Dispense Refill  . fexofenadine (ALLEGRA) 180 MG tablet Take 180 mg by mouth daily.      Marland Kitchen. HYDROcodone-acetaminophen (NORCO/VICODIN)  5-325 MG per tablet Take 1-2 tablets by mouth every 6 (six) hours as needed for moderate pain.  20 tablet  0  . Multiple Vitamin (MULTIVITAMIN WITH MINERALS) TABS Take 1 tablet by mouth daily.      Marland Kitchen PAMELOR 10 MG capsule Take 1-2 capsules (10-20 mg total) by mouth at bedtime.  60 capsule  5  . valACYclovir (VALTREX) 1000 MG tablet 2 tabs po q12h x 2 doses for acute outbreak of sores on lips  20 tablet  1   No current facility-administered medications on file prior to visit.    ALLERGIES: No Known Allergies  FAMILY HISTORY: Family History  Problem Relation Age of Onset  . Hypertension Other   .  Hyperlipidemia Other   . Hypertension Father   . Hyperlipidemia Father     SOCIAL HISTORY: History   Social History  . Marital Status: Married    Spouse Name: N/A    Number of Children: N/A  . Years of Education: N/A   Occupational History  . [employer] Production designer, theatre/television/film    Social History Main Topics  . Smoking status: Former Games developer  . Smokeless tobacco: Never Used  . Alcohol Use: Yes     Comment: occasional use  . Drug Use: No  . Sexual Activity: Yes   Other Topics Concern  . Not on file   Social History Narrative   Married, no children.   Manager at [employer].   Regular exercise, yes.   5 pack-yr smoking hx; quit about 2005.  Alc: 2 beers a day, no wine or hard liquor.            REVIEW OF SYSTEMS: Constitutional: No fevers, chills, or sweats, no generalized fatigue, +change in appetite Eyes: No visual changes, double vision, eye pain Ear, nose and throat: No hearing loss, ear pain, nasal congestion, sore throat Cardiovascular: No chest pain, palpitations Respiratory:  No shortness of breath at rest or with exertion, wheezes GastrointestinaI: + nausea, no vomiting, diarrhea, abdominal pain, fecal incontinence Genitourinary:  No dysuria, urinary retention or frequency Musculoskeletal:  + neck pain, no back pain Integumentary: No rash, pruritus, skin lesions Neurological: as above Psychiatric: No depression, insomnia, anxiety Endocrine: No palpitations, fatigue, diaphoresis, mood swings, change in appetite, change in weight, increased thirst Hematologic/Lymphatic:  No anemia, purpura, petechiae. Allergic/Immunologic: no itchy/runny eyes, nasal congestion, recent allergic reactions, rashes  PHYSICAL EXAM: Filed Vitals:   08/14/14 1447  BP: 110/82  Pulse: 75  Resp: 18   General: No acute distress Head:  Normocephalic/atraumatic Eyes: Right eye is tearing today, with note of conjunctival injection. No ptosis.  Fundoscopic exam shows bilateral sharp discs, no  vessel changes, exudates, or hemorrhages Neck: supple, no paraspinal tenderness, full range of motion Back: No paraspinal tenderness Heart: regular rate and rhythm Lungs: Clear to auscultation bilaterally. Vascular: No carotid bruits. Skin/Extremities: No rash, no edema Neurological Exam: Mental status: alert and oriented to person, place, and time, no dysarthria or aphasia, Fund of knowledge is appropriate.  Recent and remote memory are intact.  Attention and concentration are normal.    Able to name objects and repeat phrases. Cranial nerves: CN I: not tested CN II: pupils equal, round and reactive to light, visual fields intact, fundi unremarkable. CN III, IV, VI:  full range of motion, no nystagmus, no ptosis CN V: facial sensation intact CN VII: upper and lower face symmetric CN VIII: hearing intact to finger rub CN IX, X: gag intact, uvula midline CN XI: sternocleidomastoid  and trapezius muscles intact CN XII: tongue midline Bulk & Tone: normal, no fasciculations. Motor: 5/5 throughout with no pronator drift. Sensation: intact to light touch, cold, pin, vibration and joint position sense.  No extinction to double simultaneous stimulation.  Romberg test negative Deep Tendon Reflexes: +2 throughout, no ankle clonus Plantar responses: downgoing bilaterally Cerebellar: no incoordination on finger to nose, heel to shin. No dysdiadochokinesia Gait: narrow-based and steady, able to tandem walk adequately. Tremor: none  IMPRESSION: This is a pleasant 38 year old right-handed man presenting with history and exam consistent with cluster headaches. He is having a cluster episode today with right eye tearing, redness, and pain in the right side. Imitrex PO ineffective. He will take a 10-day course of steroids and will try subcutaneous Imitrex. He will also start Verapamil for cluster headache prophylaxis. Side effects were discussed. If he continues to have headaches, prescription for home O2  will be sent. He will follow-up in 1 month.  Thank you for allowing me to participate in the care of this patient. Please do not hesitate to call for any questions or concerns.   Patrcia Dolly, M.D.  CC: Dylan. Milinda Cave

## 2014-08-14 NOTE — Telephone Encounter (Signed)
Spoke to pt, he said pharmacy took care of it already.

## 2014-08-16 ENCOUNTER — Encounter: Payer: Self-pay | Admitting: Neurology

## 2014-08-16 DIAGNOSIS — G44019 Episodic cluster headache, not intractable: Secondary | ICD-10-CM | POA: Insufficient documentation

## 2014-08-24 ENCOUNTER — Other Ambulatory Visit: Payer: Self-pay

## 2014-09-11 ENCOUNTER — Telehealth: Payer: Self-pay | Admitting: Neurology

## 2014-09-11 NOTE — Telephone Encounter (Signed)
Pt called to cancel 4 week follow up appt w/ Dr. Karel JarvisAquino. He did not wish to r/s at this time. He will call later in the future if needed / Sherri S.

## 2014-09-12 ENCOUNTER — Ambulatory Visit: Payer: BC Managed Care – PPO | Admitting: Neurology

## 2014-12-17 ENCOUNTER — Ambulatory Visit: Payer: BC Managed Care – PPO | Admitting: Family Medicine

## 2015-08-20 ENCOUNTER — Other Ambulatory Visit: Payer: Self-pay | Admitting: Internal Medicine

## 2015-08-23 ENCOUNTER — Encounter: Payer: Self-pay | Admitting: Family Medicine

## 2015-08-23 ENCOUNTER — Ambulatory Visit (INDEPENDENT_AMBULATORY_CARE_PROVIDER_SITE_OTHER): Payer: BLUE CROSS/BLUE SHIELD | Admitting: Family Medicine

## 2015-08-23 VITALS — BP 114/78 | HR 70 | Temp 98.0°F | Resp 16 | Ht 75.0 in | Wt 210.0 lb

## 2015-08-23 DIAGNOSIS — L989 Disorder of the skin and subcutaneous tissue, unspecified: Secondary | ICD-10-CM | POA: Diagnosis not present

## 2015-08-23 DIAGNOSIS — G4733 Obstructive sleep apnea (adult) (pediatric): Secondary | ICD-10-CM

## 2015-08-23 MED ORDER — PRAMIPEXOLE DIHYDROCHLORIDE 0.125 MG PO TABS: ORAL_TABLET | ORAL | Status: DC

## 2015-08-23 MED ORDER — SUMATRIPTAN SUCCINATE 100 MG PO TABS: 100.0000 mg | ORAL_TABLET | ORAL | Status: DC | PRN

## 2015-08-23 NOTE — Progress Notes (Signed)
Pre visit review using our clinic review tool, if applicable. No additional management support is needed unless otherwise documented below in the visit note. 

## 2015-08-23 NOTE — Progress Notes (Signed)
OFFICE VISIT  08/24/2015   CC:  Chief Complaint  Patient presents with  . Skin Problem    on back x 3-4 weeks  . Fatigue    wants sleep study   HPI:    Patient is a 39 y.o. Caucasian male who presents for 1) spot on back 2) asks for sleep study referral.  He noted a palpable spot on the skin of his R shoulder blade a couple weeks ago, keeps picking at it but it is not really itching or painful.  He has snoring and wife says he has possible apneic events in sleep, he has daytime fatigue.  Also describes >20 years hx of some excessive leg movement during sleep.  Also in daytime, he describes an uncomfortable feeling of restlessness in both legs, feels like he has to move them/stretch them/rub them to relieve it.  Sx's at betime don't bother him if he lies on stomach.  Getting up and walking around helps briefly.  Has never been treated for RLS before and never seen a specialist before.  Essentially this is the first time he has brought it up to a doctor.  Past Medical History  Diagnosis Date  . Cluster headaches 02/27/2010    Long history of HA's.  CT head neg 02/2013.  CT sinuses neg 2014.  . LOW BACK PAIN 08/15/2009  . TOBACCO USE, QUIT 08/15/2009  . Environmental allergies     Past Surgical History  Procedure Laterality Date  . Nasal sinus surgery  2014   MEDS: Sumatriptan , 1 tab po qd prn migrain  Outpatient Prescriptions Prior to Visit  Medication Sig Dispense Refill  . fexofenadine (ALLEGRA) 180 MG tablet Take 180 mg by mouth daily.    Marland Kitchen HYDROcodone-acetaminophen (NORCO/VICODIN) 5-325 MG per tablet Take 1-2 tablets by mouth every 6 (six) hours as needed for moderate pain. 20 tablet 0  . Multiple Vitamin (MULTIVITAMIN WITH MINERALS) TABS Take 1 tablet by mouth daily.    Marland Kitchen PAMELOR 10 MG capsule Take 1-2 capsules (10-20 mg total) by mouth at bedtime. 60 capsule 5  . SUMAtriptan (IMITREX) 6 MG/0.5ML SOLN injection Inject 0.5 mLs (6 mg total) into the skin every 2 (two)  hours as needed for migraine or headache. May repeat in 2 hours if headache persists or recurs. 9 vial 3  . valACYclovir (VALTREX) 1000 MG tablet 2 tabs po q12h x 2 doses for acute outbreak of sores on lips 20 tablet 1  . verapamil (CALAN) 40 MG tablet Take 1 tablet daily for 1 week, then increase to 2 tablets daily 60 tablet 2  . predniSONE (DELTASONE) 20 MG tablet Take 3 tablets daily for 5 days, then on day 6 take 2.5 tabs, day 7 take 2 tabs, day 8 take 1.5 tabs, day 9 take 1 tab, day 10 take 1/2 tab, then stop. (Patient not taking: Reported on 08/23/2015) 23 tablet 0   No facility-administered medications prior to visit.    No Known Allergies  ROS As per HPI  PE: Blood pressure 114/78, pulse 70, temperature 98 F (36.7 C), temperature source Oral, resp. rate 16, height  (1.905 m), weight 210 lb (95.255 kg), SpO2 93 %. Gen: Alert, well appearing.  Patient is oriented to person, place, time, and situation. SKIN: R suprascap region with 1 cm oval slightly palpable plaque with raised serpiginous borders and depressed center, telangectasias present on lesion--the lesion is essentially flesh colored.   No further exam today.  LABS:  Lab Results  Component Value Date   WBC 7.7 06/18/2014   HGB 14.0 06/18/2014   HCT 41.2 06/18/2014   MCV 90.4 06/18/2014   PLT 276.0 06/18/2014    IMPRESSION AND PLAN:  1) Skin lesion on back: refer to derm for further evaluation.  2) OSA (question of): pt requests pulm referral for further evaluation.  3) Restless legs syndrome: he has daytime symptoms as well as nighttime sx's: Will start trial of pramipexol 0.125, 1 tab tid and titrate up to 2 tabs tid prn. Therapeutic expectations and side effect profile of medication discussed today.  Patient's questions answered.  An After Visit Summary was printed and given to the patient.  FOLLOW UP: Return in about 4 weeks (around 09/20/2015) for f/u restless legs.

## 2015-08-28 ENCOUNTER — Encounter: Payer: Self-pay | Admitting: Family Medicine

## 2015-09-10 ENCOUNTER — Institutional Professional Consult (permissible substitution): Payer: BLUE CROSS/BLUE SHIELD | Admitting: Pulmonary Disease

## 2015-09-20 ENCOUNTER — Institutional Professional Consult (permissible substitution): Payer: BLUE CROSS/BLUE SHIELD | Admitting: Pulmonary Disease

## 2015-09-20 ENCOUNTER — Ambulatory Visit: Payer: BLUE CROSS/BLUE SHIELD | Admitting: Family Medicine

## 2015-09-26 ENCOUNTER — Encounter: Payer: Self-pay | Admitting: Pulmonary Disease

## 2015-09-26 ENCOUNTER — Ambulatory Visit (INDEPENDENT_AMBULATORY_CARE_PROVIDER_SITE_OTHER): Payer: BLUE CROSS/BLUE SHIELD | Admitting: Pulmonary Disease

## 2015-09-26 VITALS — BP 108/76 | HR 78 | Temp 98.0°F | Ht 74.0 in | Wt 212.0 lb

## 2015-09-26 DIAGNOSIS — R0683 Snoring: Secondary | ICD-10-CM | POA: Diagnosis not present

## 2015-09-26 DIAGNOSIS — G4733 Obstructive sleep apnea (adult) (pediatric): Secondary | ICD-10-CM

## 2015-09-26 NOTE — Patient Instructions (Signed)
Dylan Pope-- it was nice meeting you today...  We will arrange for a "split night sleep study" ASAP...  Once I haver the results, I will call you to discuss the results and where we go from there (ie- CPAP etc).  Try the "teniis ball trick" in the meanwhile...  Call for any questions...  Marland Kitchen

## 2015-09-26 NOTE — Progress Notes (Signed)
Subjective:     Patient ID: Dylan Pope, male   DOB: 1976/08/07, 39 y.o.   MRN: [id number]  HPI  ~  September 26, 2015:  Initial sleep consult by SN>        62 y/o WM, referred by [facility] for a sleep evaluation;  Stacie has had a prob w/ snoring x yrs w/ vocal complaints from wife requiring him to sleep in the other room 4-5 nights per week; she has noted that he stops breathing & is often restless; when he awakes in the AM he still feels tired, notes some sleepiness during the day & often naps on his lunch break; he also has restless leg symptoms & has been treated w/ Mirapex 0.125mg  1-2tabs Tid;  He is not obese; his ESS=9.      He is an ex-smoker having started at age 13, smoked for ~23yrs up to 1/2ppd, quit in 2010 without difficulty...      He has had a rare bronchitic infection in the past, no hx pneumonia, no TB or known exposure, and employed as a Production designer, theatre/television/film at [employer] w/o asbestos exposure hx or other known toxic chemicals etc...      He had allergy testing by DrWhelan in 2012 w/ pos skin test reactions to dust mites, pollen, etc...      He had a hx of deviated septum along w/ HAs/ allergies/ & the snoring problem leading up to ENT surg 2014 by DrShoemaker- HA & sinuses are better but the snoring continues...      He was evaluated by [facility], for HAs and Dx w/ cluster HAs- treated w/ Verapamil, Pamelor, Imitrex...      His wife has a Hx narcolepsy & hypersomnia- followed by Neurology, DrDohmeier on Adderall Rx...       EXAM shows Afeb, VSS, O2sat=98%;  212#, 6'2"Tall, BMI=27;  HEENT- neg, mallamapti2;  Chest- clear w/o w/r/r;  Heart- RR w/o m/r/g;  Abd- soft, neg;  Ext- neg w/o c/c/e;  Neuro- intact...  He has never had a baseline CXR during any of his physical exams from primary care...  He has never had a baseline EKG during any of his physical exams from primary care...  LABS 2015> all essentially wnl... CBC was normal w/o eosinophilia noted... IMP/PLAN>>   Byard gives a compelling hx for OSA, snoring, RLS and we will set him up for a Split-night study at the [facility] sleep lab ASAP; we will contact him w/ the results when avail...     Past Medical History  Diagnosis Date  . Cluster headaches 02/27/2010    Long history of HA's.  CT head neg 02/2013.  CT sinuses neg 2014.  . LOW BACK PAIN 08/15/2009  . TOBACCO USE, QUIT 08/15/2009  . Environmental allergies     Past Surgical History  Procedure Laterality Date  . Nasal sinus surgery  2014    Outpatient Encounter Prescriptions as of 09/26/2015  Medication Sig  . fluticasone (FLONASE) 50 MCG/ACT nasal spray Place 2 sprays into both nostrils daily.  . Multiple Vitamin (MULTIVITAMIN WITH MINERALS) TABS Take 1 tablet by mouth daily.  Marland Kitchen PAMELOR 10 MG capsule Take 1-2 capsules (10-20 mg total) by mouth at bedtime.  . pramipexole (MIRAPEX) 0.125 MG tablet 1-2 tabs po tid for restless legs  . SUMAtriptan (IMITREX) 100 MG tablet Take 1 tablet (100 mg total) by mouth every 2 (two) hours as needed for migraine.  . SUMAtriptan (IMITREX) 6 MG/0.5ML SOLN injection Inject 0.5 mLs (  6 mg total) into the skin every 2 (two) hours as needed for migraine or headache. May repeat in 2 hours if headache persists or recurs.  . valACYclovir (VALTREX) 1000 MG tablet 2 tabs po q12h x 2 doses for acute outbreak of sores on lips  . verapamil (CALAN) 40 MG tablet Take 1 tablet daily for 1 week, then increase to 2 tablets daily  . fexofenadine (ALLEGRA) 180 MG tablet Take 180 mg by mouth daily.  . [DISCONTINUED] HYDROcodone-acetaminophen (NORCO/VICODIN) 5-325 MG per tablet Take 1-2 tablets by mouth every 6 (six) hours as needed for moderate pain. (Patient not taking: Reported on 09/26/2015)   No facility-administered encounter medications on file as of 09/26/2015.    No Known Allergies   Family History  Problem Relation Age of Onset  . Hypertension Other   . Hyperlipidemia Other   . Hypertension Father   . Hyperlipidemia  Father     Social History   Social History  . Marital Status: Married    Spouse Name: N/A  . Number of Children: N/A  . Years of Education: N/A   Occupational History  . [employer] Production designer, theatre/television/film    Social History Main Topics  . Smoking status: Former Smoker -- 0.50 packs/day for 12 years    Types: Cigarettes    Quit date: 09/25/2009  . Smokeless tobacco: Never Used  . Alcohol Use: 0.0 oz/week    0 Standard drinks or equivalent per week     Comment: occasional use  . Drug Use: No  . Sexual Activity: Yes   Other Topics Concern  . Not on file   Social History Narrative   Married, no children.   Manager at [employer].   Regular exercise, yes.   5 pack-yr smoking hx; quit about 2005.  Alc: 2 beers a day, no wine or hard liquor.            Current Medications, Allergies, Past Medical History, Past Surgical History, Family History, and Social History were reviewed in [employer] record.   Review of Systems             All symptoms NEG except where BOLDED >>  Constitutional:  F/C/S, fatigue, anorexia, unexpected weight change. HEENT:  HA, visual changes, hearing loss, earache, nasal symptoms, sore throat, mouth sores, hoarseness. Resp:  cough, sputum, hemoptysis; SOB, tightness, wheezing. Cardio:  CP, palpit, DOE, orthopnea, edema. GI:  N/V/D/C, blood in stool; reflux, abd pain, distention, gas. GU:  dysuria, freq, urgency, hematuria, flank pain, voiding difficulty. MS:  joint pain, swelling, tenderness, decr ROM; neck pain, back pain, etc. Neuro:  HA, tremors, seizures, dizziness, syncope, weakness, numbness, gait abn. Skin:  suspicious lesions or skin rash. Heme:  adenopathy, bruising, bleeding. Psyche:  confusion, agitation, sleep disturbance, hallucinations, anxiety, depression suicidal.   Objective:   Physical Exam       Vital Signs:  Reviewed...  General:  WD, WN, 39 y/o WM in NAD; alert & oriented; pleasant & cooperative... HEENT:   [place]/AT; Conjunctiva- pink, Sclera- nonicteric, EOM-wnl, PERRLA, Fundi-benign; EACs-clear, TMs-wnl; NOSE-clear; THROAT-clear & wnl. Neck:  Supple w/ full ROM; no JVD; normal carotid impulses w/o bruits; no thyromegaly or nodules palpated; no lymphadenopathy. Chest:  Clear to P & A; without wheezes, rales, or rhonchi heard. Heart:  Regular Rhythm; norm S1 & S2 without murmurs, rubs, or gallops detected. Abdomen:  Soft & nontender- no guarding or rebound; normal bowel sounds; no organomegaly or masses palpated. Ext:  Normal ROM; without  deformities or arthritic changes; no varicose veins, venous insuffic, or edema;  Pulses intact w/o bruits. Neuro:  CNs II-XII intact; motor testing normal; sensory testing normal; gait normal & balance OK. Derm:  No lesions noted; no rash etc. Lymph:  No cervical, supraclavicular, axillary, or inguinal adenopathy palpated.   Assessment:      IMP >>      Snoring      Prob OSA      RLS      Medical issues:  Allergies, HAs...  PLAN >>       Cartel gives a compelling hx for OSA, snoring, RLS and we will set him up for a Split-night study at the [facility] sleep lab ASAP; we will contact him w/ the results when avail     Plan:     Patient's Medications  New Prescriptions   No medications on file  Previous Medications   FEXOFENADINE (ALLEGRA) 180 MG TABLET    Take 180 mg by mouth daily.   FLUTICASONE (FLONASE) 50 MCG/ACT NASAL SPRAY    Place 2 sprays into both nostrils daily.   MULTIPLE VITAMIN (MULTIVITAMIN WITH MINERALS) TABS    Take 1 tablet by mouth daily.   PAMELOR 10 MG CAPSULE    Take 1-2 capsules (10-20 mg total) by mouth at bedtime.   PRAMIPEXOLE (MIRAPEX) 0.125 MG TABLET    1-2 tabs po tid for restless legs   SUMATRIPTAN (IMITREX) 100 MG TABLET    Take 1 tablet (100 mg total) by mouth every 2 (two) hours as needed for migraine.   SUMATRIPTAN (IMITREX) 6 MG/0.5ML SOLN INJECTION    Inject 0.5 mLs (6 mg total) into the skin every 2 (two) hours as needed for  migraine or headache. May repeat in 2 hours if headache persists or recurs.   VALACYCLOVIR (VALTREX) 1000 MG TABLET    2 tabs po q12h x 2 doses for acute outbreak of sores on lips   VERAPAMIL (CALAN) 40 MG TABLET    Take 1 tablet daily for 1 week, then increase to 2 tablets daily  Modified Medications   No medications on file  Discontinued Medications   HYDROCODONE-ACETAMINOPHEN (NORCO/VICODIN) 5-325 MG PER TABLET    Take 1-2 tablets by mouth every 6 (six) hours as needed for moderate pain.

## 2015-09-30 ENCOUNTER — Ambulatory Visit: Payer: BLUE CROSS/BLUE SHIELD | Admitting: Family Medicine

## 2015-11-13 ENCOUNTER — Ambulatory Visit: Payer: BLUE CROSS/BLUE SHIELD | Admitting: Family Medicine

## 2015-11-15 ENCOUNTER — Ambulatory Visit (INDEPENDENT_AMBULATORY_CARE_PROVIDER_SITE_OTHER): Payer: BLUE CROSS/BLUE SHIELD | Admitting: Family Medicine

## 2015-11-15 ENCOUNTER — Encounter: Payer: Self-pay | Admitting: Family Medicine

## 2015-11-15 VITALS — BP 117/81 | HR 78 | Temp 97.3°F | Resp 16 | Ht 74.0 in | Wt 220.5 lb

## 2015-11-15 DIAGNOSIS — J069 Acute upper respiratory infection, unspecified: Secondary | ICD-10-CM

## 2015-11-15 DIAGNOSIS — B9789 Other viral agents as the cause of diseases classified elsewhere: Principal | ICD-10-CM

## 2015-11-15 NOTE — Patient Instructions (Signed)
Get otc generic robitussin DM OR Mucinex DM and use as directed on the packaging for cough and congestion. Use otc generic saline nasal spray 2-3 times per day to irrigate/moisturize your nasal passages.   

## 2015-11-15 NOTE — Progress Notes (Signed)
OFFICE NOTE  11/15/2015  CC:  Chief Complaint  Patient presents with  . URI    x 4 days   HPI: Patient is a 40 y.o. Caucasian male who is here for respiratory complaints.   Onset 5 d/a with nasal/sinus congestion and ST, then couple days later started getting cough. Ears feel stopped up.  No fever.  No SOB or wheezing.  Question of upper chest tightness.  +PND. No HA except some mild peri-orbital sinus pain and mild shoulder aches, otherwise no body aches.  Pertinent PMH:  Past medical, surgical, social, and family history reviewed and no changes are noted since last office visit.  MEDS:  Outpatient Prescriptions Prior to Visit  Medication Sig Dispense Refill  . fexofenadine (ALLEGRA) 180 MG tablet Take 180 mg by mouth daily.    . fluticasone (FLONASE) 50 MCG/ACT nasal spray Place 2 sprays into both nostrils daily.    . Multiple Vitamin (MULTIVITAMIN WITH MINERALS) TABS Take 1 tablet by mouth daily.    Marland Kitchen PAMELOR 10 MG capsule Take 1-2 capsules (10-20 mg total) by mouth at bedtime. 60 capsule 5  . pramipexole (MIRAPEX) 0.125 MG tablet 1-2 tabs po tid for restless legs 180 tablet 1  . SUMAtriptan (IMITREX) 100 MG tablet Take 1 tablet (100 mg total) by mouth every 2 (two) hours as needed for migraine. 10 tablet 6  . SUMAtriptan (IMITREX) 6 MG/0.5ML SOLN injection Inject 0.5 mLs (6 mg total) into the skin every 2 (two) hours as needed for migraine or headache. May repeat in 2 hours if headache persists or recurs. 9 vial 3  . valACYclovir (VALTREX) 1000 MG tablet 2 tabs po q12h x 2 doses for acute outbreak of sores on lips 20 tablet 1  . verapamil (CALAN) 40 MG tablet Take 1 tablet daily for 1 week, then increase to 2 tablets daily 60 tablet 2   No facility-administered medications prior to visit.    PE: Blood pressure 117/81, pulse 78, temperature 97.3 F (36.3 C), temperature source Oral, resp. rate 16, height 6\' 2"  (1.88 m), weight 220 lb 8 oz (100.018 kg), SpO2 95 %. VS:  noted--normal. Gen: alert, NAD, NONTOXIC APPEARING. HEENT: eyes without injection, drainage, or swelling.  Ears: EACs clear, TMs with normal light reflex and landmarks.  Nose: Clear rhinorrhea, with some dried, crusty exudate adherent to mildly injected mucosa.  No purulent d/c.  No paranasal sinus TTP.  No facial swelling.  Throat and mouth without focal lesion.  No pharyngial swelling, erythema, or exudate.   Neck: supple, no LAD.   LUNGS: CTA bilat, nonlabored resps.   CV: RRR, no m/r/g. EXT: no c/c/e SKIN: no rash  IMPRESSION AND PLAN:  Viral URI with cough. No sign of RAD.   Get otc generic robitussin DM OR Mucinex DM and use as directed on the packaging for cough and congestion. Use otc generic saline nasal spray 2-3 times per day to irrigate/moisturize your nasal passages. Signs/symptoms to call or return for were reviewed and pt expressed understanding.  An After Visit Summary was printed and given to the patient.  FOLLOW UP: prn

## 2015-11-15 NOTE — Progress Notes (Signed)
Pre visit review using our clinic review tool, if applicable. No additional management support is needed unless otherwise documented below in the visit note. 

## 2015-11-20 ENCOUNTER — Ambulatory Visit (HOSPITAL_BASED_OUTPATIENT_CLINIC_OR_DEPARTMENT_OTHER): Payer: BLUE CROSS/BLUE SHIELD

## 2015-12-16 ENCOUNTER — Telehealth: Payer: Self-pay | Admitting: *Deleted

## 2015-12-16 NOTE — Telephone Encounter (Signed)
3 attempts were made to contact pt in regards to the flu vaccine. Pt has not returned calls. Flu vaccine will be declined for 2016.

## 2016-01-01 ENCOUNTER — Ambulatory Visit: Payer: BLUE CROSS/BLUE SHIELD | Admitting: Family Medicine

## 2016-01-07 ENCOUNTER — Telehealth: Payer: Self-pay | Admitting: *Deleted

## 2016-01-07 NOTE — Telephone Encounter (Signed)
Pt LMOM on 01/07/16 at 4:43pm stating that he has been taking sumatriptan and pramipexole for his cluster headaches but they are not helping. He stated that his headaches are more like migraines and wants to know is something else can be called in. Please advise. Thanks.

## 2016-01-15 ENCOUNTER — Ambulatory Visit (HOSPITAL_BASED_OUTPATIENT_CLINIC_OR_DEPARTMENT_OTHER): Payer: BLUE CROSS/BLUE SHIELD | Attending: Pulmonary Disease | Admitting: Radiology

## 2016-01-15 DIAGNOSIS — R0683 Snoring: Secondary | ICD-10-CM | POA: Diagnosis not present

## 2016-01-15 DIAGNOSIS — R5383 Other fatigue: Secondary | ICD-10-CM | POA: Diagnosis not present

## 2016-01-15 DIAGNOSIS — G4733 Obstructive sleep apnea (adult) (pediatric): Secondary | ICD-10-CM | POA: Diagnosis present

## 2016-01-15 DIAGNOSIS — G478 Other sleep disorders: Secondary | ICD-10-CM | POA: Diagnosis not present

## 2016-01-15 DIAGNOSIS — G4761 Periodic limb movement disorder: Secondary | ICD-10-CM | POA: Insufficient documentation

## 2016-01-15 DIAGNOSIS — R51 Headache: Secondary | ICD-10-CM | POA: Diagnosis not present

## 2016-01-24 DIAGNOSIS — G478 Other sleep disorders: Secondary | ICD-10-CM | POA: Diagnosis not present

## 2016-01-24 NOTE — Progress Notes (Signed)
Patient Name: Dylan Pope, Dylan Pope Date: 01/15/2016 Gender: Male D.O.B: [birth year] Age (years): 40 Referring Provider: Alroy Dust Height (inches): 74 Interpreting Physician: Coralyn Helling MD, ABSM Weight (lbs): 210 RPSGT: Shelah Lewandowsky BMI: 27 MRN: [id number] Neck Size: 15.00  CLINICAL INFORMATION Sleep Study Type: NPSG Indication for sleep study: Fatigue, Morning Headaches, OSA, Snoring, Witnessed Apneas Epworth Sleepiness Score: 11  SLEEP STUDY TECHNIQUE As per the AASM Manual for the Scoring of Sleep and Associated Events v2.3 (April 2016) with a hypopnea requiring 4% desaturations. The channels recorded and monitored were frontal, central and occipital EEG, electrooculogram (EOG), submentalis EMG (chin), nasal and oral airflow, thoracic and abdominal wall motion, anterior tibialis EMG, snore microphone, electrocardiogram, and pulse oximetry.  MEDICATIONS Patient's medications include: reviewed in electronic medical record. Medications self-administered by patient during sleep study : No sleep medicine administered.  SLEEP ARCHITECTURE The study was initiated at 10:58:54 PM and ended at 4:55:42 AM. Sleep onset time was 80.2 minutes and the sleep efficiency was 54.4%. The total sleep time was 194.0 minutes. Stage REM latency was 246.5 minutes. The patient spent 17.27% of the night in stage N1 sleep, 71.39% in stage N2 sleep, 0.00% in stage N3 and 11.34% in REM. Alpha intrusion was absent. Supine sleep was 35.53%.  RESPIRATORY PARAMETERS The overall apnea/hypopnea index (AHI) was 0.0 per hour. There were 0 total apneas, including 0 obstructive, 0 central and 0 mixed apneas. There were 0 hypopneas and 67 RERAs.  The respiratory disturbance index (RDI) is 20.7. The AHI during Stage REM sleep was 0.0 per hour. AHI while supine was 0.0 per hour. The mean oxygen saturation was 96.50%. The minimum SpO2 during sleep was 92.00%. Moderate snoring was noted during this study.  CARDIAC  DATA The 2 lead EKG demonstrated sinus rhythm. The mean heart rate was 57.24 beats per minute. Other EKG findings include: None.  LEG MOVEMENT DATA The total PLMS were 123 with a resulting PLMS index of 38.04. Associated arousal with leg movement index was 7.4 .  IMPRESSIONS He had several respiratory events with a respiratory disturbance index of 20.7.  These events were not associated with oxygen desaturation, and therefore his apnea hypopnea index is 0.  He had an oxygen saturation nadir of 92%.  His findings are consistent with upper airway resistance syndrome.  He had a significant increase in his periodic ljmb movement index.  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 - Sleep hygiene should be reviewed to assess factors that may improve sleep quality. - Weight management and regular exercise should be initiated or continued if appropriate. - Assess for presence of restless leg syndrome   Coralyn Helling, MD, ABSM Diplomate, American Board of Sleep Medicine 01/24/2016, 10:14 AM  NPI: [id number]

## 2016-02-10 ENCOUNTER — Encounter: Payer: Self-pay | Admitting: Adult Health

## 2016-02-10 ENCOUNTER — Other Ambulatory Visit (INDEPENDENT_AMBULATORY_CARE_PROVIDER_SITE_OTHER): Payer: BLUE CROSS/BLUE SHIELD

## 2016-02-10 ENCOUNTER — Ambulatory Visit (INDEPENDENT_AMBULATORY_CARE_PROVIDER_SITE_OTHER): Payer: BLUE CROSS/BLUE SHIELD | Admitting: Adult Health

## 2016-02-10 VITALS — BP 108/84 | HR 71 | Temp 98.0°F | Ht 74.0 in | Wt 207.0 lb

## 2016-02-10 DIAGNOSIS — G4733 Obstructive sleep apnea (adult) (pediatric): Secondary | ICD-10-CM | POA: Diagnosis not present

## 2016-02-10 DIAGNOSIS — G2581 Restless legs syndrome: Secondary | ICD-10-CM

## 2016-02-10 LAB — CBC WITH DIFFERENTIAL/PLATELET
BASOS PCT: 0.6 % (ref 0.0–3.0)
Basophils Absolute: 0.1 10*3/uL (ref 0.0–0.1)
EOS PCT: 2.8 % (ref 0.0–5.0)
Eosinophils Absolute: 0.2 10*3/uL (ref 0.0–0.7)
HCT: 40.2 % (ref 39.0–52.0)
Hemoglobin: 13.5 g/dL (ref 13.0–17.0)
LYMPHS ABS: 2.6 10*3/uL (ref 0.7–4.0)
Lymphocytes Relative: 31.8 % (ref 12.0–46.0)
MCHC: 33.6 g/dL (ref 30.0–36.0)
MCV: 85.9 fl (ref 78.0–100.0)
MONOS PCT: 8.6 % (ref 3.0–12.0)
Monocytes Absolute: 0.7 10*3/uL (ref 0.1–1.0)
Neutro Abs: 4.6 10*3/uL (ref 1.4–7.7)
Neutrophils Relative %: 56.2 % (ref 43.0–77.0)
Platelets: 270 10*3/uL (ref 150.0–400.0)
RBC: 4.68 Mil/uL (ref 4.22–5.81)
RDW: 13.5 % (ref 11.5–15.5)
WBC: 8.1 10*3/uL (ref 4.0–10.5)

## 2016-02-10 LAB — IBC PANEL
Iron: 52 ug/dL (ref 42–165)
Saturation Ratios: 15.2 % — ABNORMAL LOW (ref 20.0–50.0)
Transferrin: 245 mg/dL (ref 212.0–360.0)

## 2016-02-10 MED ORDER — PRAMIPEXOLE DIHYDROCHLORIDE 0.125 MG PO TABS: 0.1250 mg | ORAL_TABLET | Freq: Every day | ORAL | Status: DC

## 2016-02-10 NOTE — Progress Notes (Signed)
Subjective:    Patient ID: Dylan Pope, male    DOB: 07/16/1976, 40 y.o.   MRN: [id number]  HPI 40 yo male seen for sleep consult 09/2015 with Dr. Kriste Basque  .   02/10/2016 Follow up : snoring and RLS  Pt returns for a follow up after recent sleep study  He was seen in November 2016 for loud snoring and daytime sleepiness.  He was set up for a split night sleep study. This was done on 01/15/16 that was neg for OSA with  AHI 0. He did have moderate snoring and felt to have upper airway resistance syndrome.  PLMS was increased at 38.04 . He has been dx in past with RLS, started on Mirapex but did not take on regular basis . [place] Spaniel he forgot to take most of the time.  Says he has leg issues that keeps him up at night and wakes him up . Also sometimes in long meetings .  No previous iron studies done. Denies known anemia.  Has nasal stuffiness and congestion. Feels stopped up a lot.     Past Medical History  Diagnosis Date  . Cluster headaches 02/27/2010    Long history of HA's.  CT head neg 02/2013.  CT sinuses neg 2014.  . LOW BACK PAIN 08/15/2009  . TOBACCO USE, QUIT 08/15/2009  . Environmental allergies   . Restless legs syndrome    Current Outpatient Prescriptions on File Prior to Visit  Medication Sig Dispense Refill  . fexofenadine (ALLEGRA) 180 MG tablet Take 180 mg by mouth daily.    . fluticasone (FLONASE) 50 MCG/ACT nasal spray Place 2 sprays into both nostrils daily.    . Multiple Vitamin (MULTIVITAMIN WITH MINERALS) TABS Take 1 tablet by mouth daily.    . SUMAtriptan (IMITREX) 100 MG tablet Take 1 tablet (100 mg total) by mouth every 2 (two) hours as needed for migraine. 10 tablet 6  . SUMAtriptan (IMITREX) 6 MG/0.5ML SOLN injection Inject 0.5 mLs (6 mg total) into the skin every 2 (two) hours as needed for migraine or headache. May repeat in 2 hours if headache persists or recurs. 9 vial 3  . valACYclovir (VALTREX) 1000 MG tablet 2 tabs po q12h x 2 doses for acute outbreak of sores on  lips 20 tablet 1  . verapamil (CALAN) 40 MG tablet Take 1 tablet daily for 1 week, then increase to 2 tablets daily 60 tablet 2  . PAMELOR 10 MG capsule Take 1-2 capsules (10-20 mg total) by mouth at bedtime. (Patient not taking: Reported on 02/10/2016) 60 capsule 5   No current facility-administered medications on file prior to visit.    Review of Systems Constitutional:   No  weight loss, night sweats,  Fevers, chills,  +fatigue, or  lassitude.  HEENT:   No headaches,  Difficulty swallowing,  Tooth/dental problems, or  Sore throat,                No sneezing, itching, ear ache, nasal congestion, post nasal drip,   CV:  No chest pain,  Orthopnea, PND, swelling in lower extremities, anasarca, dizziness, palpitations, syncope.   GI  No heartburn, indigestion, abdominal pain, nausea, vomiting, diarrhea, change in bowel habits, loss of appetite, bloody stools.   Resp: No shortness of breath with exertion or at rest.  No excess mucus, no productive cough,  No non-productive cough,  No coughing up of blood.  No change in color of mucus.  No wheezing.  No chest wall deformity  Skin: no rash or lesions.  GU: no dysuria, change in color of urine, no urgency or frequency.  No flank pain, no hematuria   MS:  No joint pain or swelling.  No decreased range of motion.  No back pain.  Psych:  No change in mood or affect. No depression or anxiety.  No memory loss.         Objective:   Physical Exam Filed Vitals:   02/10/16 1116  BP: 108/84  Pulse: 71  Temp: 98 F (36.7 C)  TempSrc: Oral  Height: 6\' 2"  (1.88 m)  Weight: 207 lb (93.895 kg)  SpO2: 97%  Body mass index is 26.57 kg/(m^2).    GEN: A/Ox3; pleasant , NAD, well nourished   HEENT:  [place]/AT,  EACs-clear, TMs-wnl, NOSE-clear, THROAT-clear, no lesions, no postnasal drip or exudate noted. Class 1-2 MP airway   NECK:  Supple w/ fair ROM; no JVD; normal carotid impulses w/o bruits; no thyromegaly or nodules palpated; no  lymphadenopathy.  RESP  Clear  P & A; w/o, wheezes/ rales/ or rhonchi.no accessory muscle use, no dullness to percussion  CARD:  RRR, no m/r/g  , no peripheral edema, pulses intact, no cyanosis or clubbing.  GI:   Soft & nt; nml bowel sounds; no organomegaly or masses detected.  Musco: Warm bil, no deformities or joint swelling noted.   Neuro: alert, no focal deficits noted.    Skin: Warm, no lesions or rashes  Tammy Parrett NP-C  [facility]  02/10/2016       Assessment & Plan:

## 2016-02-10 NOTE — Assessment & Plan Note (Signed)
Upper airway resistance syndrome on sleep study , no sign OSA with AHI at 0.  Will tx RLS to see if help daytime sleepiness.

## 2016-02-10 NOTE — Patient Instructions (Addendum)
Labs today  Restart Mirapex At bedtime.  Use saline nasal rinses Twice daily   Add Flonase 2 puffs At bedtime   Follow up in 3 months with Dr. Kriste Basque  Please contact office for sooner follow up if symptoms do not improve or worsen or seek emergency care

## 2016-02-10 NOTE — Assessment & Plan Note (Signed)
RLS with PLMS at 34 on sleep study  encouarged on Mirapex compliance  Advised on daytime activity  Check iron levels.   Plan  Labs today  Restart Mirapex At bedtime.  Follow up in 3 months with Dr. Kriste Basque  Please contact office for sooner follow up if symptoms do not improve or worsen or seek emergency care

## 2016-02-25 NOTE — Progress Notes (Signed)
Quick Note:  LVM for pt to return call ______ 

## 2016-02-27 ENCOUNTER — Telehealth: Payer: Self-pay | Admitting: Pulmonary Disease

## 2016-02-27 NOTE — Telephone Encounter (Signed)
Notes Recorded by Julio Sicks, NP on 02/10/2016 at 5:34 PM Iron level is on low normal  Recommend add otc iron daily  Recheck levels in 3 months ov    Called spoke with pt. Reviewed results and recs. Pt voiced understanding and had no further questions. Nothing further needed.

## 2016-02-27 NOTE — Progress Notes (Signed)
Quick Note:  LVM for pt to return call ______ 

## 2016-02-27 NOTE — Progress Notes (Signed)
Quick Note:  Called spoke with pt. Reviewed results and recs. Pt voiced understanding and had no further questions. Nothing further needed. ______ 

## 2016-03-06 ENCOUNTER — Ambulatory Visit: Payer: BLUE CROSS/BLUE SHIELD | Admitting: Adult Health

## 2016-04-08 DIAGNOSIS — C44519 Basal cell carcinoma of skin of other part of trunk: Secondary | ICD-10-CM | POA: Diagnosis not present

## 2016-04-21 DIAGNOSIS — L089 Local infection of the skin and subcutaneous tissue, unspecified: Secondary | ICD-10-CM | POA: Diagnosis not present

## 2016-05-06 ENCOUNTER — Ambulatory Visit (INDEPENDENT_AMBULATORY_CARE_PROVIDER_SITE_OTHER): Payer: BLUE CROSS/BLUE SHIELD | Admitting: Pulmonary Disease

## 2016-05-06 ENCOUNTER — Encounter: Payer: Self-pay | Admitting: Pulmonary Disease

## 2016-05-06 VITALS — BP 110/80 | HR 74 | Temp 97.9°F | Ht 74.0 in | Wt 213.0 lb

## 2016-05-06 DIAGNOSIS — R0683 Snoring: Secondary | ICD-10-CM

## 2016-05-06 DIAGNOSIS — G4733 Obstructive sleep apnea (adult) (pediatric): Secondary | ICD-10-CM | POA: Diagnosis not present

## 2016-05-06 DIAGNOSIS — G2581 Restless legs syndrome: Secondary | ICD-10-CM | POA: Diagnosis not present

## 2016-05-06 DIAGNOSIS — G478 Other sleep disorders: Secondary | ICD-10-CM

## 2016-05-06 NOTE — Patient Instructions (Signed)
Today we updated your med list in our EPIC system...    Continue your current medications the same...  We are going to arrange for a CPAP set up to help your sleep apnea and SNORING problem...  We will request a CPAP machine w/ an auto CPAP setting (set 5-15)...    We will need to recheck you w/ a "download" in about 2 months...  Call for any questions.Marland KitchenMarland Kitchen

## 2016-05-06 NOTE — Progress Notes (Signed)
Subjective:     Patient ID: Dylan Pope, male   DOB: January 30, 1976, 40 y.o.   MRN: [id number]  HPI   ~  September 26, 2015:  Initial sleep consult by SN>        26 y/o WM, referred by [facility] for a sleep evaluation;  Dylan Pope has had a prob w/ snoring x yrs w/ vocal complaints from wife requiring him to sleep in the other room 4-5 nights per week; she has noted that he stops breathing & is often restless; when he awakes in the AM he still feels tired, notes some sleepiness during the day & often naps on his lunch break; he also has restless leg symptoms & has been treated w/ Mirapex 0.125mg  1-2tabs Tid;  He is not obese; his ESS=9.      He is an ex-smoker having started at age 36, smoked for ~51yrs up to 1/2ppd, quit in 2010 without difficulty...      He has had a rare bronchitic infection in the past, no hx pneumonia, no TB or known exposure, and employed as a Production designer, theatre/television/film at [employer] w/o asbestos exposure hx or other known toxic chemicals etc...      He had allergy testing by DrWhelan in 2012 w/ pos skin test reactions to dust mites, pollen, etc...      He had a hx of deviated septum along w/ HAs/ allergies/ & the snoring problem leading up to ENT surg 2014 by DrShoemaker- HA & sinuses are better but the snoring continues...      He was evaluated by [facility], for HAs and Dx w/ cluster HAs- treated w/ Verapamil, Pamelor, Imitrex...      His wife has a Hx narcolepsy & hypersomnia- followed by Neurology, DrDohmeier on Adderall Rx... EXAM shows Afeb, VSS, O2sat=98%;  212#, 6'2"Tall, BMI=27;  HEENT- neg, mallamapti2;  Chest- clear w/o w/r/r;  Heart- RR w/o m/r/g;  Abd- soft, neg;  Ext- neg w/o c/c/e;  Neuro- intact...  He has never had a baseline CXR during any of his physical exams from primary care...  He has never had a baseline EKG during any of his physical exams from primary care...  LABS 2015> all essentially wnl... CBC was normal w/o eosinophilia noted... IMP/PLAN>>  Dylan Pope  gives a compelling hx for OSA, snoring, RLS and we will set him up for a Split-night study at the [facility] sleep lab ASAP; we will contact him w/ the results when avail...   ~  May 06, 2016:  2mo ROV w/ SN>        Dylan Pope tells me that he had to cancel his prev Sleep Study & f/u appt, therefore rescheduled PSG wasn't done until March>  He saw TP 02/10/16 & the leg movements were noted=> tried on Mirapex 0.125 Qhs but he may need a larger dose as he has RLS symptoms during the day as well... Again he requested help because his SNORING is so bad that he has to sllep in spare bedroom due to disrupting his wife's sleep...      EXAM shows Afeb, VSS, O2sat=97%;  213#, 6'2"Tall, BMI=27;  HEENT- neg, mallamapti2;  Chest- clear w/o w/r/r;  Heart- RR w/o m/r/g;  Abd- soft, neg;  Ext- neg w/o c/c/e;  Neuro- intact...  LABS 02/2016>  CBC- wnl, not anemixc w/ Hg=13.5, MCV=86;  Fe=52 (15%sat)  PSG 01/15/16 showed 6H study w/ sleep efficiency of only 54% (only 3+hrs of sleep)- 71% stage2 and only 11% REM; his AHI  was zero but he had 67 RERAs for an RDI=20.7; lowest O2sat=92%; there was mod snoring throughout; no EKG changes or arrhythmias; PLMS=123 w/ an index of 38 (arousal index w/ leg movements=7.4/hr);  This was all felt to be c/w upper airway resistance syndrome, mod loud snoring, and signif PLMS arousal index...  IMP>>        Loud snoring and abn PSG w/ RDI=20.7 due to RERAs and upper airway resistance syndrome>       RLS w/ PLMS index of 38/hr & arousal index=7.4/hr> he notes some uncomfortable RLS symptoms during the day as well      Ex-smoker> he has a <10 pack-year smoking hx & quit in 2010...      AR> he has +allergy testing by DrWhelan to pollen, dust mites, etc; treated w/ OTC Antihist, Flonase, Saline nasal mist...      S/P ENT eval by DrShoemaker in 2014> s/p surg w/ nasal septoplasty & inferior turbinate reduction w/ signif improvement in nasal congestion & obstruction; they did not discuss poss UPPP for his  snoring...      Hx cluster HAs>  eval by Neuro DrAquino 08/2014- long hx HAs w/ NEG CT head & sinuses in 2014; on Imitrex w/ variable response; felt to have prob cluster HAs- given oral Pred taper & SQ Imitrex 6mg =1/2cc SQ prn) plus Verapamil 40mg Bid for prophylaxis; he cancelled f/u appt w/ Neuro... PLAN>>        I discussed Dylan Pope's particular situation w/ DrYoung for Sleep Med-- pt definitely wants to try CPAP for his snoring & while he did not have any apneas or desaturation, his RDI= 20.7 based on RERAs w/ an arousal index of 7.4/hr;  Therefore we will order CPAP w/ a ResMed S10- air/auto 5-15 and plan f/u OV recheck w/ download in 6-8weeks...     Past Medical History  Diagnosis Date  . Cluster headaches 02/27/2010    Long history of HA's.  CT head neg 02/2013.  CT sinuses neg 2014.  . LOW BACK PAIN 08/15/2009  . TOBACCO USE, QUIT 08/15/2009  . Environmental allergies   . Restless legs syndrome     Past Surgical History  Procedure Laterality Date  . Nasal sinus surgery  2014    Outpatient Encounter Prescriptions as of 05/06/2016  Medication Sig  . fexofenadine (ALLEGRA) 180 MG tablet Take 180 mg by mouth daily.  . fluticasone (FLONASE) 50 MCG/ACT nasal spray Place 2 sprays into both nostrils daily.  . Multiple Vitamin (MULTIVITAMIN WITH MINERALS) TABS Take 1 tablet by mouth daily.  . pramipexole (MIRAPEX) 0.125 MG tablet Take 1 tablet (0.125 mg total) by mouth at bedtime.  . SUMAtriptan (IMITREX) 100 MG tablet Take 1 tablet (100 mg total) by mouth every 2 (two) hours as needed for migraine.  . SUMAtriptan (IMITREX) 6 MG/0.5ML SOLN injection Inject 0.5 mLs (6 mg total) into the skin every 2 (two) hours as needed for migraine or headache. May repeat in 2 hours if headache persists or recurs.  . valACYclovir (VALTREX) 1000 MG tablet 2 tabs po q12h x 2 doses for acute outbreak of sores on lips  . verapamil (CALAN) 40 MG tablet Take 1 tablet daily for 1 week, then increase to 2 tablets daily   . PAMELOR 10 MG capsule Take 1-2 capsules (10-20 mg total) by mouth at bedtime. (Patient not taking: Reported on 05/06/2016)   No facility-administered encounter medications on file as of 05/06/2016.    No Known Allergies   Family History  Problem Relation Age of Onset  . Hypertension Other   . Hyperlipidemia Other   . Hypertension Father   . Hyperlipidemia Father     Social History   Social History  . Marital Status: Married    Spouse Name: N/A  . Number of Children: N/A  . Years of Education: N/A   Occupational History  . [employer] Production designer, theatre/television/film    Social History Main Topics  . Smoking status: Former Smoker -- 0.50 packs/day for 12 years    Types: Cigarettes    Quit date: 09/25/2009  . Smokeless tobacco: Never Used  . Alcohol Use: 0.0 oz/week    0 Standard drinks or equivalent per week     Comment: occasional use  . Drug Use: No  . Sexual Activity: Yes   Other Topics Concern  . Not on file   Social History Narrative   Married, no children.   Manager at [employer].   Regular exercise, yes.   5 pack-yr smoking hx; quit about 2005.  Alc: 2 beers a day, no wine or hard liquor.            Current Medications, Allergies, Past Medical History, Past Surgical History, Family History, and Social History were reviewed in [employer] record.   Review of Systems             All symptoms NEG except where BOLDED >>  Constitutional:  F/C/S, fatigue, anorexia, unexpected weight change. HEENT:  HA, visual changes, hearing loss, earache, nasal symptoms, sore throat, mouth sores, hoarseness. Resp:  cough, sputum, hemoptysis; SOB, tightness, wheezing. Cardio:  CP, palpit, DOE, orthopnea, edema. GI:  N/V/D/C, blood in stool; reflux, abd pain, distention, gas. GU:  dysuria, freq, urgency, hematuria, flank pain, voiding difficulty. MS:  joint pain, swelling, tenderness, decr ROM; neck pain, back pain, etc. Neuro:  HA, tremors, seizures, dizziness,  syncope, weakness, numbness, gait abn. Skin:  suspicious lesions or skin rash. Heme:  adenopathy, bruising, bleeding. Psyche:  confusion, agitation, sleep disturbance, hallucinations, anxiety, depression suicidal.   Objective:   Physical Exam       Vital Signs:  Reviewed...  General:  WD, WN, 40 y/o WM in NAD; alert & oriented; pleasant & cooperative... HEENT:  [place]/AT; Conjunctiva- pink, Sclera- nonicteric, EOM-wnl, PERRLA, Fundi-benign; EACs-clear, TMs-wnl; NOSE-clear; THROAT-clear & wnl. Neck:  Supple w/ full ROM; no JVD; normal carotid impulses w/o bruits; no thyromegaly or nodules palpated; no lymphadenopathy. Chest:  Clear to P & A; without wheezes, rales, or rhonchi heard. Heart:  Regular Rhythm; norm S1 & S2 without murmurs, rubs, or gallops detected. Abdomen:  Soft & nontender- no guarding or rebound; normal bowel sounds; no organomegaly or masses palpated. Ext:  Normal ROM; without deformities or arthritic changes; no varicose veins, venous insuffic, or edema;  Pulses intact w/o bruits. Neuro:  CNs II-XII intact; motor testing normal; sensory testing normal; gait normal & balance OK. Derm:  No lesions noted; no rash etc. Lymph:  No cervical, supraclavicular, axillary, or inguinal adenopathy palpated.   Assessment:      IMP >>      Loud snoring and abn PSG w/ RDI=20.7 due to RERAs and upper airway resistance syndrome> see PSG done 01/15/16 & read by DrSood.      RLS w/ PLMS index of 38/hr & arousal index=7.4/hr> he notes some uncomfortable RLS symptoms during the day as well; on Mirapex 0.125mg  Qhs & extra during the day prn.      Ex-smoker> he has a <  10 pack-year smoking hx & quit in 2010...      AR> he has +allergy testing by DrWhelan to pollen, dust mites, etc; treated w/ OTC Antihist, Flonase, Saline nasal mist...      S/P ENT eval by DrShoemaker in 2014> s/p surg w/ nasal septoplasty & inferior turbinate reduction w/ signif improvement in nasal congestion & obstruction; they  did not discuss poss UPPP for his snoring...      Hx cluster HAs>  eval by Neuro DrAquino 08/2014- long hx HAs w/ NEG CT head & sinuses in 2014; on Imitrex w/ variable response; felt to have prob cluster HAs- given oral Pred taper & SQ Imitrex =1/2cc SQ prn) plus Verapamil Bid for prophylaxis; he cancelled f/u appt w/ Neuro...  PLAN >>  09/26/15>   Sasan gives a compelling hx for OSA, snoring, RLS and we will set him up for a Split-night study at the [facility] sleep lab ASAP; we will contact him w/ the results when avail 05/06/16>   I discussed Kimon's particular situation w/ DrYoung for Sleep Med-- pt definitely wants to try CPAP for his snoring & while he did not have any apneas or desaturation, his RDI= 20.7 based on RERAs w/ an arousal index of 7.4/hr;  Therefore we will order CPAP w/ a ResMed S10- air/auto 5-15 and plan f/u OV recheck w/ download in 6-8weeks.     Plan:     Patient's Medications  New Prescriptions                                   We are starting CPAP >> ResMed S10, Auto 5-15, Mask of choice  Previous Medications   FEXOFENADINE (ALLEGRA) 180 MG TABLET    Take 180 mg by mouth daily.   FLUTICASONE (FLONASE) 50 MCG/ACT NASAL SPRAY    Place 2 sprays into both nostrils daily.   MULTIPLE VITAMIN (MULTIVITAMIN WITH MINERALS) TABS    Take 1 tablet by mouth daily.   PAMELOR 10 MG CAPSULE    Take 1-2 capsules (10-20 mg total) by mouth at bedtime.   PRAMIPEXOLE (MIRAPEX) 0.125 MG TABLET    Take 1 tablet (0.125 mg total) by mouth at bedtime.   SUMATRIPTAN (IMITREX) 100 MG TABLET    Take 1 tablet (100 mg total) by mouth every 2 (two) hours as needed for migraine.   SUMATRIPTAN (IMITREX) 6 MG/0.5ML SOLN INJECTION    Inject 0.5 mLs (6 mg total) into the skin every 2 (two) hours as needed for migraine or headache. May repeat in 2 hours if headache persists or recurs.   VALACYCLOVIR (VALTREX) 1000 MG TABLET    2 tabs po q12h x 2 doses for acute outbreak of sores on lips   VERAPAMIL (CALAN) 40  MG TABLET    Take 1 tablet daily for 1 week, then increase to 2 tablets daily  Modified Medications   No medications on file  Discontinued Medications   No medications on file

## 2016-05-07 DIAGNOSIS — L089 Local infection of the skin and subcutaneous tissue, unspecified: Secondary | ICD-10-CM | POA: Diagnosis not present

## 2016-06-09 DIAGNOSIS — J398 Other specified diseases of upper respiratory tract: Secondary | ICD-10-CM | POA: Diagnosis not present

## 2016-06-09 DIAGNOSIS — G4733 Obstructive sleep apnea (adult) (pediatric): Secondary | ICD-10-CM | POA: Diagnosis not present

## 2016-07-07 ENCOUNTER — Ambulatory Visit: Payer: BLUE CROSS/BLUE SHIELD | Admitting: Pulmonary Disease

## 2016-07-10 DIAGNOSIS — G4733 Obstructive sleep apnea (adult) (pediatric): Secondary | ICD-10-CM | POA: Diagnosis not present

## 2016-07-28 ENCOUNTER — Encounter: Payer: Self-pay | Admitting: Pulmonary Disease

## 2016-07-28 ENCOUNTER — Ambulatory Visit (INDEPENDENT_AMBULATORY_CARE_PROVIDER_SITE_OTHER): Payer: BLUE CROSS/BLUE SHIELD | Admitting: Pulmonary Disease

## 2016-07-28 VITALS — BP 114/78 | HR 64 | Temp 97.0°F | Ht 74.0 in | Wt 215.1 lb

## 2016-07-28 DIAGNOSIS — G478 Other sleep disorders: Secondary | ICD-10-CM

## 2016-07-28 DIAGNOSIS — G2581 Restless legs syndrome: Secondary | ICD-10-CM

## 2016-07-28 DIAGNOSIS — G4733 Obstructive sleep apnea (adult) (pediatric): Secondary | ICD-10-CM

## 2016-07-28 DIAGNOSIS — R0683 Snoring: Secondary | ICD-10-CM

## 2016-07-28 NOTE — Progress Notes (Signed)
Subjective:     Patient ID: Dylan Pope, male   DOB: Mar 20, 1976, 40 y.o.   MRN: [id number]  HPI   ~  September 26, 2015:  Initial sleep consult by SN>        65 y/o WM, referred by [facility] for a sleep evaluation;  Dylan Pope has had a prob w/ snoring x yrs w/ vocal complaints from wife requiring him to sleep in the other room 4-5 nights per week; she has noted that he stops breathing & is often restless; when he awakes in the AM he still feels tired, notes some sleepiness during the day & often naps on his lunch break; he also has restless leg symptoms & has been treated w/ Mirapex 0.125mg  1-2tabs Tid;  He is not obese; his ESS=9.      He is an ex-smoker having started at age 43, smoked for ~51yrs up to 1/2ppd, quit in 2010 without difficulty...      He has had a rare bronchitic infection in the past, no hx pneumonia, no TB or known exposure, and employed as a Production designer, theatre/television/film at [employer] w/o asbestos exposure hx or other known toxic chemicals etc...      He had allergy testing by DrWhelan in 2012 w/ pos skin test reactions to dust mites, pollen, etc...      He had a hx of deviated septum along w/ HAs/ allergies/ & the snoring problem leading up to ENT surg 2014 by DrShoemaker- HA & sinuses are better but the snoring continues...      He was evaluated by [facility], for HAs and Dx w/ cluster HAs- treated w/ Verapamil, Pamelor, Imitrex...      His wife has a Hx narcolepsy & hypersomnia- followed by Neurology, DrDohmeier on Adderall Rx... EXAM shows Afeb, VSS, O2sat=98%;  212#, 6'2"Tall, BMI=27;  HEENT- neg, mallamapti2;  Chest- clear w/o w/r/r;  Heart- RR w/o m/r/g;  Abd- soft, neg;  Ext- neg w/o c/c/e;  Neuro- intact...  He has never had a baseline CXR during any of his physical exams from primary care...  He has never had a baseline EKG during any of his physical exams from primary care...  LABS 2015> all essentially wnl... CBC was normal w/o eosinophilia noted... IMP/PLAN>>  Dylan Pope  gives a compelling hx for OSA, snoring, RLS and we will set him up for a Split-night study at the [facility] sleep lab ASAP; we will contact him w/ the results when avail...   ~  May 06, 2016:  5mo ROV w/ SN>        Dylan Pope tells me that he had to cancel his prev Sleep Study & f/u appt, therefore rescheduled PSG wasn't done until March>  He saw TP 02/10/16 & the leg movements were noted=> tried on Mirapex 0.125 Qhs but he may need a larger dose as he has RLS symptoms during the day as well... Again he requested help because his SNORING is so bad that he has to sleep in spare bedroom due to disrupting his wife's sleep...      EXAM shows Afeb, VSS, O2sat=97%;  213#, 6'2"Tall, BMI=27;  HEENT- neg, mallamapti2;  Chest- clear w/o w/r/r;  Heart- RR w/o m/r/g;  Abd- soft, neg;  Ext- neg w/o c/c/e;  Neuro- intact...  LABS 02/2016>  CBC- wnl, not anemixc w/ Hg=13.5, MCV=86;  Fe=52 (15%sat)  PSG 01/15/16 showed 6H study w/ sleep efficiency of only 54% (only 3+hrs of sleep)- 71% stage2 and only 11% REM; his AHI  was zero but he had 67 RERAs for an RDI=20.7; lowest O2sat=92%; there was mod snoring throughout; no EKG changes or arrhythmias; PLMS=123 w/ an index of 38 (arousal index w/ leg movements=7.4/hr);  This was all felt to be c/w upper airway resistance syndrome, mod loud snoring, and signif PLMS arousal index...  IMP>>        Loud snoring and abn PSG w/ RDI=20.7 due to RERAs and upper airway resistance syndrome>       RLS w/ PLMS index of 38/hr & arousal index=7.4/hr> he notes some uncomfortable RLS symptoms during the day as well      Ex-smoker> he has a <10 pack-year smoking hx & quit in 2010...      AR> he has +allergy testing by DrWhelan to pollen, dust mites, etc; treated w/ OTC Antihist, Flonase, Saline nasal mist...      S/P ENT eval by DrShoemaker in 2014> s/p surg w/ nasal septoplasty & inferior turbinate reduction w/ signif improvement in nasal congestion & obstruction; they did not discuss poss UPPP for his  snoring...      Hx cluster HAs>  eval by Neuro DrAquino 08/2014- long hx HAs w/ NEG CT head & sinuses in 2014; on Imitrex w/ variable response; felt to have prob cluster HAs- given oral Pred taper & SQ Imitrex 6mg =1/2cc SQ prn) plus Verapamil 40mg Bid for prophylaxis; he cancelled f/u appt w/ Neuro... PLAN>>        I discussed Dylan Pope particular situation w/ DrYoung for Sleep Med-- pt definitely wants to try CPAP for his snoring & while he did not have any apneas or desaturation, his RDI= 20.7 based on RERAs w/ an arousal index of 7.4/hr;  Therefore we will order CPAP w/ a ResMed S10- air/auto 5-15 and plan f/u OV recheck w/ download in 6-8weeks...   ~  July 28, 2016:  16mo ROV & Dylan Pope returns for a follow up of his sleep disordered breathing> using nasal pillows w/ some irritation & he has ordered some lotion for this... We obtained a CPAP download:  8/20 - 07/27/16 showed good compliance 29/30 days & >4h per night for 77% (23d), ave 5.5 hr per night used;  Nasal pillows, autoset 5-15 w/ mean pressure 7cmH2O and max 11cmH2O, no signif leaks, and AHI=2.5 events/hr...  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 He reports much better than before- no daytime sleepiness, not napping, not snoring, wife is pleased, & overall doing OK...     EXAM shows Afeb, VSS, O2sat=98%;  215#, 6'2"Tall, BMI=27;  HEENT- neg, mallamapti2;  Chest- clear w/o w/r/r;  Heart- RR w/o m/r/g;  Abd- soft, neg;  Ext- neg w/o c/c/e;  Neuro- intact... IMP/PLAN>>  Dylan Pope is much improved on his CPAP Qhs;  Download shows adeq compliance & a good improvement w/ elim of daytime sleepiness & elim of snoring making his wife very happy!  Continue same rx...       Past Medical History:  Diagnosis Date  . Cluster headaches 02/27/2010   Long history of HA's.  CT head neg 02/2013.  CT sinuses neg 2014.  . Environmental allergies   . LOW BACK PAIN 08/15/2009  . Restless legs syndrome   .  TOBACCO USE, QUIT 08/15/2009    Past Surgical History:  Procedure Laterality Date  . NASAL SINUS SURGERY  2014    Outpatient Encounter Prescriptions as of 07/28/2016  Medication Sig  . fexofenadine (ALLEGRA) 180 MG tablet  Take 180 mg by mouth daily.  . fluticasone (FLONASE) 50 MCG/ACT nasal spray Place 2 sprays into both nostrils daily.  . Multiple Vitamin (MULTIVITAMIN WITH MINERALS) TABS Take 1 tablet by mouth daily.  . SUMAtriptan (IMITREX) 100 MG tablet Take 1 tablet (100 mg total) by mouth every 2 (two) hours as needed for migraine.  . SUMAtriptan (IMITREX) 6 MG/0.5ML SOLN injection Inject 0.5 mLs (6 mg total) into the skin every 2 (two) hours as needed for migraine or headache. May repeat in 2 hours if headache persists or recurs.  . valACYclovir (VALTREX) 1000 MG tablet 2 tabs po q12h x 2 doses for acute outbreak of sores on lips  . verapamil (CALAN) 40 MG tablet Take 1 tablet daily for 1 week, then increase to 2 tablets daily  . PAMELOR 10 MG capsule Take 1-2 capsules (10-20 mg total) by mouth at bedtime. (Patient not taking: Reported on 07/28/2016)  . pramipexole (MIRAPEX) 0.125 MG tablet Take 1 tablet (0.125 mg total) by mouth at bedtime. (Patient not taking: Reported on 07/28/2016)   No facility-administered encounter medications on file as of 07/28/2016.     No Known Allergies   Family History  Problem Relation Age of Onset  . Hypertension Other   . Hyperlipidemia Other   . Hypertension Father   . Hyperlipidemia Father     Social History   Social History  . Marital status: Married    Spouse name: N/A  . Number of children: N/A  . Years of education: N/A   Occupational History  . [employer] manager [employer]   Social History Main Topics  . Smoking status: Former Smoker    Packs/day: 0.50    Years: 12.00    Types: Cigarettes    Quit date: 09/25/2009  . Smokeless tobacco: Never Used  . Alcohol use 0.0 oz/week     Comment: occasional use  . Drug use:  No  . Sexual activity: Yes   Other Topics Concern  . Not on file   Social History Narrative   Married, no children.   Manager at [employer].   Regular exercise, yes.   5 pack-yr smoking hx; quit about 2005.  Alc: 2 beers a day, no wine or hard liquor.            Current Medications, Allergies, Past Medical History, Past Surgical History, Family History, and Social History were reviewed in [facility] electronic medical record.   Review of Systems             All symptoms NEG except where BOLDED >>  Constitutional:  F/C/S, fatigue, anorexia, unexpected weight change. HEENT:  HA, visual changes, hearing loss, earache, nasal symptoms, sore throat, mouth sores, hoarseness. Resp:  cough, sputum, hemoptysis; SOB, tightness, wheezing. Cardio:  CP, palpit, DOE, orthopnea, edema. GI:  N/V/D/C, blood in stool; reflux, abd pain, distention, gas. GU:  dysuria, freq, urgency, hematuria, flank pain, voiding difficulty. MS:  joint pain, swelling, tenderness, decr ROM; neck pain, back pain, etc. Neuro:  HA, tremors, seizures, dizziness, syncope, weakness, numbness, gait abn. Skin:  suspicious lesions or skin rash. Heme:  adenopathy, bruising, bleeding. Psyche:  confusion, agitation, sleep disturbance, hallucinations, anxiety, depression suicidal.   Objective:   Physical Exam       Vital Signs:  Reviewed...   General:  WD, WN, 40 y/o WM in NAD; alert & oriented; pleasant & cooperative... HEENT:  Dylan Pope; Conjunctiva- pink, Sclera- nonicteric, EOM-wnl, PERRLA, Fundi-benign; EACs-clear, TMs-wnl; NOSE-clear; THROAT-clear & wnl.  Neck:  Supple w/ full ROM; no JVD; normal carotid impulses w/o bruits; no thyromegaly or nodules palpated; no lymphadenopathy.  Chest:  Clear to P & A; without wheezes, rales, or rhonchi heard. Heart:  Regular Rhythm; norm S1 & S2 without murmurs, rubs, or gallops detected. Abdomen:  Soft & nontender- no guarding or rebound; normal bowel sounds; no  organomegaly or masses palpated. Ext:  Normal ROM; without deformities or arthritic changes; no varicose veins, venous insuffic, or edema;  Pulses intact w/o bruits. Neuro:  CNs II-XII intact; motor testing normal; sensory testing normal; gait normal & balance OK. Derm:  No lesions noted; no rash etc. Lymph:  No cervical, supraclavicular, axillary, or inguinal adenopathy palpated.   Assessment:      IMP >>      Loud snoring and abn PSG w/ RDI=20.7 due to RERAs and upper airway resistance syndrome> see PSG done 01/15/16 & read by DrSood.      RLS w/ PLMS index of 38/hr & arousal index=7.4/hr> he notes some uncomfortable RLS symptoms during the day as well; on Mirapex 0.125mg  Qhs & extra during the day prn.      Ex-smoker> he has a <10 pack-year smoking hx & quit in 2010...      AR> he has +allergy testing by DrWhelan to pollen, dust mites, etc; treated w/ OTC Antihist, Flonase, Saline nasal mist...      S/P ENT eval by DrShoemaker in 2014> s/p surg w/ nasal septoplasty & inferior turbinate reduction w/ signif improvement in nasal congestion & obstruction; they did not discuss poss UPPP for his snoring...      Hx cluster HAs>  eval by Neuro DrAquino 08/2014- long hx HAs w/ NEG CT head & sinuses in 2014; on Imitrex w/ variable response; felt to have prob cluster HAs- given oral Pred taper & SQ Imitrex 6mg =1/2cc SQ prn) plus Verapamil 40mg Bid for prophylaxis; he cancelled f/u appt w/ Neuro...  PLAN >>  09/26/15>   Dylan Pope gives a compelling hx for OSA, snoring, RLS and we will set him up for a Split-night study at the [facility] sleep lab ASAP; we will contact him w/ the results when avail 05/06/16>   I discussed Dylan Pope particular situation w/ DrYoung for Sleep Med-- pt definitely wants to try CPAP for his snoring & while he did not have any apneas or desaturation, his RDI= 20.7 based on RERAs w/ an arousal index of 7.4/hr;  Therefore we will order CPAP w/ a ResMed S10- air/auto 5-15 and plan f/u OV recheck w/  download in 6-8weeks.     Plan:      Patient's Medications  New Prescriptions   No medications on file  Previous Medications   FEXOFENADINE (ALLEGRA) 180 MG TABLET    Take 180 mg by mouth daily.   FLUTICASONE (FLONASE) 50 MCG/ACT NASAL SPRAY    Place 2 sprays into both nostrils daily.   MULTIPLE VITAMIN (MULTIVITAMIN WITH MINERALS) TABS    Take 1 tablet by mouth daily.   PAMELOR 10 MG CAPSULE    Take 1-2 capsules (10-20 mg total) by mouth at bedtime.   PRAMIPEXOLE (MIRAPEX) 0.125 MG TABLET    Take 1 tablet (0.125 mg total) by mouth at bedtime.   SUMATRIPTAN (IMITREX) 100 MG TABLET    Take 1 tablet (100 mg total) by mouth every 2 (two) hours as needed for migraine.   SUMATRIPTAN (IMITREX) 6 MG/0.5ML SOLN INJECTION    Inject 0.5 mLs (6 mg total) into the skin every 2 (  two) hours as needed for migraine or headache. May repeat in 2 hours if headache persists or recurs.   VALACYCLOVIR (VALTREX) 1000 MG TABLET    2 tabs po q12h x 2 doses for acute outbreak of sores on lips   VERAPAMIL (CALAN) 40 MG TABLET    Take 1 tablet daily for 1 week, then increase to 2 tablets daily  Modified Medications   No medications on file  Discontinued Medications   No medications on file

## 2016-07-28 NOTE — Patient Instructions (Signed)
Today we updated your med list in our EPIC system...    Continue your current medications the same...  Great job with the CPAP-- your download shows excellent compliance & efficacy...    Keep up the good work...  Call for any questions...  Let's plan a follow up visit in 49yr, sooner if needed for problems.Marland KitchenMarland Kitchen

## 2016-08-09 DIAGNOSIS — G4733 Obstructive sleep apnea (adult) (pediatric): Secondary | ICD-10-CM | POA: Diagnosis not present

## 2016-08-17 DIAGNOSIS — D225 Melanocytic nevi of trunk: Secondary | ICD-10-CM | POA: Diagnosis not present

## 2016-08-17 DIAGNOSIS — D2271 Melanocytic nevi of right lower limb, including hip: Secondary | ICD-10-CM | POA: Diagnosis not present

## 2016-08-17 DIAGNOSIS — D229 Melanocytic nevi, unspecified: Secondary | ICD-10-CM | POA: Diagnosis not present

## 2016-08-17 DIAGNOSIS — Z85828 Personal history of other malignant neoplasm of skin: Secondary | ICD-10-CM | POA: Diagnosis not present

## 2016-09-09 DIAGNOSIS — G4733 Obstructive sleep apnea (adult) (pediatric): Secondary | ICD-10-CM | POA: Diagnosis not present

## 2016-09-29 ENCOUNTER — Ambulatory Visit (INDEPENDENT_AMBULATORY_CARE_PROVIDER_SITE_OTHER): Payer: BLUE CROSS/BLUE SHIELD | Admitting: Family Medicine

## 2016-09-29 ENCOUNTER — Encounter: Payer: Self-pay | Admitting: Family Medicine

## 2016-09-29 VITALS — BP 107/68 | HR 96 | Temp 98.0°F | Resp 16 | Ht 74.0 in | Wt 215.0 lb

## 2016-09-29 DIAGNOSIS — M25561 Pain in right knee: Secondary | ICD-10-CM | POA: Diagnosis not present

## 2016-09-29 DIAGNOSIS — G43011 Migraine without aura, intractable, with status migrainosus: Secondary | ICD-10-CM

## 2016-09-29 DIAGNOSIS — G44019 Episodic cluster headache, not intractable: Secondary | ICD-10-CM

## 2016-09-29 MED ORDER — PREDNISONE 20 MG PO TABS: ORAL_TABLET | ORAL | 0 refills | Status: DC

## 2016-09-29 MED ORDER — SUMATRIPTAN 20 MG/ACT NA SOLN: 20.0000 mg | NASAL | 6 refills | Status: DC | PRN

## 2016-09-29 MED ORDER — KETOROLAC TROMETHAMINE 60 MG/2ML IM SOLN
60.0000 mg | Freq: Once | INTRAMUSCULAR | Status: AC
  Administered 2016-09-29: 60 mg via INTRAMUSCULAR

## 2016-09-29 MED ORDER — VERAPAMIL HCL 40 MG PO TABS: ORAL_TABLET | ORAL | 6 refills | Status: DC

## 2016-09-29 NOTE — Progress Notes (Signed)
Pre visit review using our clinic review tool, if applicable. No additional management support is needed unless otherwise documented below in the visit note. 

## 2016-09-29 NOTE — Progress Notes (Signed)
OFFICE VISIT  09/29/2016   CC:  Chief Complaint  Patient presents with  . Headache    x 10 days   HPI:    Patient is a 40 y.o. Caucasian male who presents for headaches.  Has hx of cluster HAs and migraine HAs. Onset 10 days ago, hurting behind both eyes, forehead, top of head, temples, occipital area. Worst areas are peri-orbital, forehead, temples.  No asymmetry.  No tearing of eyes.  No vision abnormalities. No eyelid drooping.   Some nausea w/out vomiting.  Mild photo and phonophobia.  No trigger identified. Sleep pattern and stress levels unchanged prior to onset.  No new environment. Tried sumatriptan tabs but is too afraid to do the injections that he has been rx'd by his neurologist in the past.  1000 mg tylenol alt with 400 mg ibuprofen--takes this regimen on and off.   He took verapamil for a period of time after seeing Dr. Karel JarvisAquino but did not get this RF'd for unclear reason.   Same thing with nortriptyline that I rx'd.  At the end of his visit today he requested a referral to orthopedist or sports med MD for right knee pain. He also wanted to discuss chronic fatigue he has been having.  I told him he'd have to schedule a separate appt to cover this topic since we had our hands full today.   Past Medical History:  Diagnosis Date  . Cluster headaches 02/27/2010   Long history of HA's.  CT head neg 02/2013.  CT sinuses neg 2014.  . Environmental allergies   . LOW BACK PAIN 08/15/2009  . Restless legs syndrome    Sleep study 2017  . TOBACCO USE, QUIT 08/15/2009    Past Surgical History:  Procedure Laterality Date  . NASAL SINUS SURGERY  2014    Outpatient Medications Prior to Visit  Medication Sig Dispense Refill  . fexofenadine (ALLEGRA) 180 MG tablet Take 180 mg by mouth daily.    . fluticasone (FLONASE) 50 MCG/ACT nasal spray Place 2 sprays into both nostrils daily.    . Multiple Vitamin (MULTIVITAMIN WITH MINERALS) TABS Take 1 tablet by mouth daily.    .  SUMAtriptan (IMITREX) 100 MG tablet Take 1 tablet (100 mg total) by mouth every 2 (two) hours as needed for migraine. 10 tablet 6  . SUMAtriptan (IMITREX) 6 MG/0.5ML SOLN injection Inject 0.5 mLs (6 mg total) into the skin every 2 (two) hours as needed for migraine or headache. May repeat in 2 hours if headache persists or recurs. 9 vial 3  . PAMELOR 10 MG capsule Take 1-2 capsules (10-20 mg total) by mouth at bedtime. (Patient not taking: Reported on 09/29/2016) 60 capsule 5  . pramipexole (MIRAPEX) 0.125 MG tablet Take 1 tablet (0.125 mg total) by mouth at bedtime. (Patient not taking: Reported on 09/29/2016) 30 tablet 3  . valACYclovir (VALTREX) 1000 MG tablet 2 tabs po q12h x 2 doses for acute outbreak of sores on lips (Patient not taking: Reported on 09/29/2016) 20 tablet 1  . verapamil (CALAN) 40 MG tablet Take 1 tablet daily for 1 week, then increase to 2 tablets daily (Patient not taking: Reported on 09/29/2016) 60 tablet 2   No facility-administered medications prior to visit.     No Known Allergies  ROS As per HPI  PE: Blood pressure 107/68, pulse 96, temperature 98 F (36.7 C), temperature source Oral, resp. rate 16, height 6\' 2"  (1.88 m), weight 215 lb (97.5 kg), SpO2 96 %. Gen:  Alert, well appearing.  Patient is oriented to person, place, time, and situation. AFFECT: pleasant, lucid thought and speech. ZCH:YIFO: no injection, icteris, swelling, or exudate.  EOMI, PERRLA. Mouth: lips without lesion/swelling.  Oral mucosa pink and moist. Oropharynx without erythema, exudate, or swelling.  CV: RRR, no m/r/g.   LUNGS: CTA bilat, nonlabored resps, good aeration in all lung fields. Neuro: CN 2-12 intact bilaterally, strength 5/5 in proximal and distal upper extremities and lower extremities bilaterally.    No tremor.  No disdiadochokinesis.  No ataxia.   No pronator drift.   LABS:    Chemistry      Component Value Date/Time   NA 138 06/18/2014 1015   K 4.1 06/18/2014 1015   CL  101 06/18/2014 1015   CO2 27 06/18/2014 1015   BUN 17 06/18/2014 1015   CREATININE 1.0 06/18/2014 1015      Component Value Date/Time   CALCIUM 9.5 06/18/2014 1015   ALKPHOS 88 06/18/2014 1015   AST 16 06/18/2014 1015   ALT 18 06/18/2014 1015   BILITOT 0.6 06/18/2014 1015     IMPRESSION AND PLAN:  Intractable migraine HA, without aura. Toradol 60mg  IM in office today. Prednisone taper: 60mg  qd x 3d, then 40mg  qd x 3d, then 20mg  qd x 3d, then 10mg  qd x 2d, then stop. He is afraid to inject himself, so we'll rx imitrex nasal spray and see if insurance will help cover it. I restarted his verapamil 40mg , start taking it qd x 7d, then increase to 2 tabs qd.   Unfortunately he has a tendency to be noncompliant with longterm meds. I'll see him back in 3-4 weeks to f/u HAs and to discuss his fatigue.  An After Visit Summary was printed and given to the patient.  FOLLOW UP: Return for f/u 3-4 wks HAs and fatigue (30 min).  Signed:  Santiago Bumpers, MD           09/29/2016

## 2016-10-23 ENCOUNTER — Ambulatory Visit: Payer: BLUE CROSS/BLUE SHIELD | Admitting: Family Medicine

## 2016-12-11 DIAGNOSIS — J398 Other specified diseases of upper respiratory tract: Secondary | ICD-10-CM | POA: Diagnosis not present

## 2016-12-11 DIAGNOSIS — G4733 Obstructive sleep apnea (adult) (pediatric): Secondary | ICD-10-CM | POA: Diagnosis not present

## 2017-02-01 DIAGNOSIS — D485 Neoplasm of uncertain behavior of skin: Secondary | ICD-10-CM | POA: Diagnosis not present

## 2017-02-01 DIAGNOSIS — D225 Melanocytic nevi of trunk: Secondary | ICD-10-CM | POA: Diagnosis not present

## 2017-04-07 DIAGNOSIS — G4733 Obstructive sleep apnea (adult) (pediatric): Secondary | ICD-10-CM | POA: Diagnosis not present

## 2017-04-07 DIAGNOSIS — J398 Other specified diseases of upper respiratory tract: Secondary | ICD-10-CM | POA: Diagnosis not present

## 2017-07-16 DIAGNOSIS — J398 Other specified diseases of upper respiratory tract: Secondary | ICD-10-CM | POA: Diagnosis not present

## 2017-07-16 DIAGNOSIS — G4733 Obstructive sleep apnea (adult) (pediatric): Secondary | ICD-10-CM | POA: Diagnosis not present

## 2017-07-28 ENCOUNTER — Ambulatory Visit: Payer: BLUE CROSS/BLUE SHIELD | Admitting: Pulmonary Disease

## 2018-02-03 ENCOUNTER — Telehealth: Payer: Self-pay | Admitting: Family Medicine

## 2018-02-03 NOTE — Telephone Encounter (Signed)
Patient is using Imitrex for headaches- he has cluster headaches. He has had the Rx of pain medication on hand in case he needed it. He has finally run out ( Rx 2015). He would like to get a refill on that Rx. Refill request: hydrocodone 5-325 mg  LOV: 09/29/16  PCP: [facility]  Pharmacy: verified

## 2018-02-03 NOTE — Telephone Encounter (Incomplete)
Copied from CRM [id number]. Topic: Quick Communication - Rx Refill/Question >> Feb 03, 2018  8:48 AM Eston Mould B wrote:  PT is asking for a refill of this medication  not on his med list  he states dr Milinda Cave  prescribed 06/2014 for headaches   Medication: hydrocodone 5 325 mg  Has the patient contacted their pharmacy? {yes [id number]} (Agent: If no, request that the patient contact the pharmacy for the refill.) Preferred Pharmacy (with phone number or street name): [id number] - [place], [place] - [address] AT [address] [facility] [address] [phone] (Phone) [phone] (Fax)     Agent: Please be advised that RX refills may take up to 3 business days. We ask that you follow-up with your pharmacy.

## 2018-02-03 NOTE — Telephone Encounter (Signed)
Apt made for 02/04/18 at 1:15pm, scheduled by P[facility].

## 2018-02-03 NOTE — Telephone Encounter (Signed)
Pt needs an office visit. Pt has not been seen in over a year and this is a controlled medication.   Left message for pt to call back to schedule an apt. Okay to schedule for 15 min.

## 2018-02-04 ENCOUNTER — Encounter: Payer: Self-pay | Admitting: Family Medicine

## 2018-02-04 ENCOUNTER — Ambulatory Visit: Payer: BLUE CROSS/BLUE SHIELD | Admitting: Family Medicine

## 2018-02-04 VITALS — BP 115/73 | HR 70 | Temp 98.5°F | Resp 16 | Ht 73.5 in | Wt 216.1 lb

## 2018-02-04 DIAGNOSIS — G43909 Migraine, unspecified, not intractable, without status migrainosus: Secondary | ICD-10-CM | POA: Diagnosis not present

## 2018-02-04 MED ORDER — HYDROCODONE-ACETAMINOPHEN 5-325 MG PO TABS: ORAL_TABLET | ORAL | 0 refills | Status: DC

## 2018-02-04 NOTE — Progress Notes (Signed)
OFFICE VISIT  02/04/2018   CC:  Chief Complaint  Patient presents with  . Follow-up    migraine   HPI:    Patient is a 42 y.o. Caucasian male who presents for 4 mo f/u migraine HA's w/out aura, hx of being intractable.  Also hx of some cluster HAs. Last visit I restarted verapamil (Calan) 40mg  qd as prophylactic HA med, instructions to titrate to 80mg  qd. Also rx'd imitrex nasal spray for abortive med. At that time I also had to do a systemic steroid taper.  He was supposed to have followed up 1 mo later but did not. He took the verapamil --of questionable help---stopped it when he ran out of it.  Has had only 2 migraines in last few months. He did use the imitrex nasal spray on at least one occasion but it did have more sedation/cloudiness side effect. He has used vicodin very sparingly if imitrex failed.  Last rx for #20 and this was 2015---he asks for RF of this med today.  Currently has no HA.  Past Medical History:  Diagnosis Date  . Cluster headaches 02/27/2010   Long history of HA's.  CT head neg 02/2013.  CT sinuses neg 2014.  . Environmental allergies   . LOW BACK PAIN 08/15/2009  . Migraine syndrome   . OSA on CPAP    Dr. Kriste Basque  . Restless legs syndrome    Sleep study 2017  . TOBACCO USE, QUIT 08/15/2009    Past Surgical History:  Procedure Laterality Date  . NASAL SINUS SURGERY  2014    Outpatient Medications Prior to Visit  Medication Sig Dispense Refill  . fexofenadine (ALLEGRA) 180 MG tablet Take 180 mg by mouth daily.    . fluticasone (FLONASE) 50 MCG/ACT nasal spray Place 2 sprays into both nostrils daily.    . Multiple Vitamin (MULTIVITAMIN WITH MINERALS) TABS Take 1 tablet by mouth daily.    . SUMAtriptan (IMITREX) 100 MG tablet Take 1 tablet (100 mg total) by mouth every 2 (two) hours as needed for migraine. 10 tablet 6  . SUMAtriptan (IMITREX) 20 MG/ACT nasal spray Place 1 spray (20 mg total) into the nose every 2 (two) hours as needed for migraine or  headache. May repeat in 2 hours if headache persists or recurs. 1 Inhaler 6  . predniSONE (DELTASONE) 20 MG tablet 3 tabs po qd x 3d, then 2 tabs po qd x 3d, then 1 tab po qd x 3d, then 1/2 tab po qd x 2d (Patient not taking: Reported on 02/04/2018) 19 tablet 0  . verapamil (CALAN) 40 MG tablet Take 1 tablet daily for 1 week, then increase to 2 tablets daily (Patient not taking: Reported on 02/04/2018) 60 tablet 6   No facility-administered medications prior to visit.     No Known Allergies  ROS As per HPI  PE: Blood pressure 115/73, pulse 70, temperature 98.5 F (36.9 C), temperature source Oral, resp. rate 16, height 6' 1.5" (1.867 m), weight 216 lb 2 oz (98 kg), SpO2 97 %. Wt Readings from Last 2 Encounters:  02/04/18 216 lb 2 oz (98 kg)  09/29/16 215 lb (97.5 kg)    Gen: alert, oriented x 4, affect pleasant.  Lucid thinking and conversation noted. HEENT: PERRLA, EOMI.   Neck: no LAD, mass, or thyromegaly. CV: RRR, no m/r/g LUNGS: CTA bilat, nonlabored. NEURO: no tremor or tics noted on observation.  Coordination intact. CN 2-12 grossly intact bilaterally, strength 5/5 in all extremeties.  No  ataxia.   LABS:    Chemistry      Component Value Date/Time   NA 138 06/18/2014 1015   K 4.1 06/18/2014 1015   CL 101 06/18/2014 1015   CO2 27 06/18/2014 1015   BUN 17 06/18/2014 1015   CREATININE 1.0 06/18/2014 1015      Component Value Date/Time   CALCIUM 9.5 06/18/2014 1015   ALKPHOS 88 06/18/2014 1015   AST 16 06/18/2014 1015   ALT 18 06/18/2014 1015   BILITOT 0.6 06/18/2014 1015     Lab Results  Component Value Date   WBC 8.1 02/10/2016   HGB 13.5 02/10/2016   HCT 40.2 02/10/2016   MCV 85.9 02/10/2016   PLT 270.0 02/10/2016   Lab Results  Component Value Date   IRON 52 02/10/2016     IMPRESSION AND PLAN:  Migraine syndrome: occurring too INFREQUENTLY to warrant prophylactic med at this time. Continue imitrex prn. Since imitrex does not effectively abort his  HAs sometimes, he has used vicodin 5/325 very sparingly when HA not signif improved with imitrex.  I rx'd vicodin 5/325, 1-2 bid prn, #30. Therapeutic expectations and side effect profile of medication discussed today.  Patient's questions answered.  An After Visit Summary was printed and given to the patient.  FOLLOW UP: Return in about 6 months (around 08/07/2018) for annual CPE (fasting).   Signed:  Santiago Bumpers, MD           02/04/2018

## 2018-05-18 ENCOUNTER — Encounter: Payer: Self-pay | Admitting: Family Medicine

## 2018-05-19 MED ORDER — VALACYCLOVIR HCL 1 G PO TABS
ORAL_TABLET | ORAL | 1 refills | Status: DC
Start: 2018-05-19 — End: 2022-01-23

## 2018-05-19 NOTE — Telephone Encounter (Signed)
Valtrex eRx'd 

## 2018-05-19 NOTE — Telephone Encounter (Signed)
Please advise. Thanks.  

## 2020-05-03 ENCOUNTER — Ambulatory Visit (INDEPENDENT_AMBULATORY_CARE_PROVIDER_SITE_OTHER): Payer: BC Managed Care – PPO | Admitting: Family Medicine

## 2020-05-03 ENCOUNTER — Encounter: Payer: Self-pay | Admitting: Family Medicine

## 2020-05-03 ENCOUNTER — Other Ambulatory Visit: Payer: Self-pay

## 2020-05-03 VITALS — BP 132/85 | HR 68 | Temp 97.6°F | Resp 16 | Ht 73.5 in | Wt 215.2 lb

## 2020-05-03 DIAGNOSIS — G43909 Migraine, unspecified, not intractable, without status migrainosus: Secondary | ICD-10-CM

## 2020-05-03 MED ORDER — HYDROCODONE-ACETAMINOPHEN 5-325 MG PO TABS: ORAL_TABLET | ORAL | 0 refills | Status: DC

## 2020-05-03 MED ORDER — UBRELVY 50 MG PO TABS: ORAL_TABLET | ORAL | 1 refills | Status: DC

## 2020-05-03 MED ORDER — DIVALPROEX SODIUM ER 500 MG PO TB24: 500.0000 mg | ORAL_TABLET | Freq: Every day | ORAL | 1 refills | Status: DC

## 2020-05-03 NOTE — Progress Notes (Signed)
OFFICE VISIT  05/14/2020   CC:  Chief Complaint  Patient presents with  . Headache    ongoing,   HPI:    Patient is a 44 y.o. Caucasian male who presents for headaches. I have not seen him since 01/2018.  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  HEADACHES: Some months has 12-15, some months 1-2 only. HA described as occurring behind both eyes, in forehead, crown of head. No nausea.  No photophobia.  Sometimes dull/constant for 3-4 days, with intermittent periods of 7-8/10 intensity.  Takes an imitrex and it helps partially only about 50% of the time.   Rest helps.   He cannot identify any trigger.    Not having any cluster HAs anymore. Saw a neurologist--last time was about 5 yrs ago--for cluster HAs. Oxygen 50% in their office not helpful per pt, got subQ triptan in their office and was started on verapamil. He did not follow up (Dr. Delice Lesch). I tried to rx frovatriptan for him back in 2015 but not covered by insurer. No other abortives or preventatives tried.  ROS: all fingers tingling both hands for the last year or so---did not discuss today.  ROS: no fevers, no CP, no SOB, no wheezing, no cough, no dizziness, no rashes, no melena/hematochezia.  No polyuria or polydipsia.  No myalgias or arthralgias.  No focal weakness, paresthesias (none WITH the HA's), or tremors.  No acute vision or hearing abnormalities. No n/v/d or abd pain.  No palpitations.    PMP AWARE: nothing listed.  Past Medical History:  Diagnosis Date  . Cluster headaches 02/27/2010   Long history of HA's.  CT head neg 02/2013.  CT sinuses neg 2014.  . Environmental allergies   . LOW BACK PAIN 08/15/2009  . Migraine syndrome   . OSA on CPAP    Dr. Lenna Gilford  . Restless legs syndrome    Sleep study 2017  . TOBACCO USE, QUIT 08/15/2009    Past Surgical History:  Procedure Laterality Date  . NASAL SINUS SURGERY  2014    Outpatient Medications Prior to Visit  Medication Sig Dispense Refill  . fluticasone  (FLONASE) 50 MCG/ACT nasal spray Place 2 sprays into both nostrils daily.    . Multiple Vitamin (MULTIVITAMIN WITH MINERALS) TABS Take 1 tablet by mouth daily.    . SUMAtriptan (IMITREX) 100 MG tablet Take 1 tablet (100 mg total) by mouth every 2 (two) hours as needed for migraine. 10 tablet 6  . valACYclovir (VALTREX) 1000 MG tablet 2 tabs po q12h x 2 doses for acute outbreak of sores on lips (Patient not taking: Reported on 05/03/2020) 20 tablet 1  . fexofenadine (ALLEGRA) 180 MG tablet Take 180 mg by mouth daily. (Patient not taking: Reported on 05/03/2020)    . HYDROcodone-acetaminophen (NORCO/VICODIN) 5-325 MG tablet 1-2 tabs bid prn for severe headache that does not significantly improve with your migraine medication (Patient not taking: Reported on 05/03/2020) 30 tablet 0  . SUMAtriptan (IMITREX) 20 MG/ACT nasal spray Place 1 spray (20 mg total) into the nose every 2 (two) hours as needed for migraine or headache. May repeat in 2 hours if headache persists or recurs. (Patient not taking: Reported on 05/03/2020) 1 Inhaler 6   No facility-administered medications prior to visit.    No Known Allergies  ROS As per HPI  PE: Blood pressure 132/85, pulse 68, temperature 97.6 F (36.4 C), temperature source Temporal, resp. rate 16, height 6' 1.5" (1.867 m), weight 215 lb 3.2 oz (97.6 kg),  SpO2 100 %. Body mass index is 28.01 kg/m.  Gen: Alert, well appearing.  Patient is oriented to person, place, time, and situation. AFFECT: pleasant, lucid thought and speech. No further exam today.  LABS:  Lab Results  Component Value Date   TSH 1.17 03/30/2014   Lab Results  Component Value Date   WBC 8.1 02/10/2016   HGB 13.5 02/10/2016   HCT 40.2 02/10/2016   MCV 85.9 02/10/2016   PLT 270.0 02/10/2016   Lab Results  Component Value Date   CREATININE 1.0 06/18/2014   BUN 17 06/18/2014   NA 138 06/18/2014   K 4.1 06/18/2014   CL 101 06/18/2014   CO2 27 06/18/2014   Lab Results  Component  Value Date   ALT 18 06/18/2014   AST 16 06/18/2014   ALKPHOS 88 06/18/2014   BILITOT 0.6 06/18/2014   Lab Results  Component Value Date   CHOL 243 (H) 03/30/2014   Lab Results  Component Value Date   HDL 52.20 03/30/2014   Lab Results  Component Value Date   LDLCALC 148 (H) 03/30/2014   Lab Results  Component Value Date   TRIG 213.0 (H) 03/30/2014   Lab Results  Component Value Date   CHOLHDL 5 03/30/2014    IMPRESSION AND PLAN:  Chronic migraine syndrome: start depakote ER 500mg  qhs for prophylaxis and ubrelvy 50mg , 1-2 at onset of HA. Therapeutic expectations and side effect profile of medication discussed today.  Patient's questions answered. I also rx'd a few vicodin 5/325 that he uses very sparingly if persistent severe HA unresponsive to any other abortive med: #30.  Spent 30 min with pt today, with >50% of this time spent in counseling and care coordination regarding the above problems.  An After Visit Summary was printed and given to the patient.  FOLLOW UP: Return in about 4 weeks (around 05/31/2020) for f/u HAs. He wants to address tingling in all fingers bilat at f/u as well.  Signed:  , MD           05/14/2020

## 2020-05-14 ENCOUNTER — Telehealth: Payer: Self-pay

## 2020-05-14 NOTE — Telephone Encounter (Signed)
PA sent via covermymed on 05/14/20   Key: [id number]   Medication: Bernita Raisin   Dx: Migraine Syndrome, G43.909   Per Dr. Milinda Cave pt has tried and failed N/A   Waiting for response.

## 2020-05-15 NOTE — Telephone Encounter (Signed)
Pharmacy was notified. Med would have to be ordered but would process thru insurance after that.

## 2020-05-15 NOTE — Telephone Encounter (Signed)
PA approved.

## 2021-07-18 ENCOUNTER — Encounter: Payer: Self-pay | Admitting: Family Medicine

## 2021-07-18 ENCOUNTER — Ambulatory Visit (INDEPENDENT_AMBULATORY_CARE_PROVIDER_SITE_OTHER): Payer: BC Managed Care – PPO | Admitting: Family Medicine

## 2021-07-18 ENCOUNTER — Other Ambulatory Visit: Payer: Self-pay

## 2021-07-18 VITALS — BP 133/89 | HR 66 | Temp 98.2°F | Resp 16 | Ht 73.5 in | Wt 218.2 lb

## 2021-07-18 DIAGNOSIS — Z131 Encounter for screening for diabetes mellitus: Secondary | ICD-10-CM

## 2021-07-18 DIAGNOSIS — H00012 Hordeolum externum right lower eyelid: Secondary | ICD-10-CM | POA: Diagnosis not present

## 2021-07-18 DIAGNOSIS — G5139 Clonic hemifacial spasm, unspecified: Secondary | ICD-10-CM

## 2021-07-18 DIAGNOSIS — Z13220 Encounter for screening for lipoid disorders: Secondary | ICD-10-CM

## 2021-07-18 MED ORDER — ERYTHROMYCIN 5 MG/GM OP OINT: 1.0000 "application " | TOPICAL_OINTMENT | Freq: Three times a day (TID) | OPHTHALMIC | 0 refills | Status: AC

## 2021-07-18 NOTE — Progress Notes (Signed)
OFFICE VISIT  07/18/2021  CC:  Chief Complaint  Patient presents with   Eye issue    R eye developed bump near tear duct around the last week of July; a stye formed and eye muscle started twitching. A 2nd bump started to form a week ago. Denies dizziness    HPI:    Patient is a 45 y.o. Caucasian male who presents for "eye and face issue". Developed R LL stye about 5 wks ago.  Still with a bump there. Says he feels like the R upper eyelid twitches some lately, then noted R eye seemed to close a little and R side of mouth drew upward "like a smirk"--he showed me picture today.  This was present approx 15-30 min.  At that time he also noted dulled, numb sensation R peri-oral region.  No facial swelling.  No tongue sx's or taste diff.  No vision abnl.  No preceding viral-type infection.  No fevers, no fatigue or focal weakness. No HA.  No dizziness or vertigo. Long hx of numbness in all fingertips.  No dizziness.  Of note, he is not taking the depakote,ubrelvi, or vicodin listed on current med list.  Neurologic FH: brother dx'd with parkinson's dz age 18.  PMP AWARE reviewed today: most recent rx for vicodin was filled 05/03/20, # 30, rx by me. No red flags.  Past Medical History:  Diagnosis Date   Cluster headaches 02/27/2010   Long history of HA's.  CT head neg 02/2013.  CT sinuses neg 2014.   Environmental allergies    LOW BACK PAIN 08/15/2009   Migraine syndrome    OSA on CPAP    Dr. Kriste Basque   Restless legs syndrome    Sleep study 2017   TOBACCO USE, QUIT 08/15/2009    Past Surgical History:  Procedure Laterality Date   NASAL SINUS SURGERY  2014    Outpatient Medications Prior to Visit  Medication Sig Dispense Refill   fluticasone (FLONASE) 50 MCG/ACT nasal spray Place 2 sprays into both nostrils daily.     Multiple Vitamin (MULTIVITAMIN WITH MINERALS) TABS Take 1 tablet by mouth daily.     divalproex (DEPAKOTE ER) 500 MG 24 hr tablet Take 1 tablet (500 mg total) by mouth at  bedtime. 30 tablet 1   HYDROcodone-acetaminophen (NORCO/VICODIN) 5-325 MG tablet 1-2 tabs bid prn for severe headache that does not significantly improve with your migraine medication 30 tablet 0   Ubrogepant (UBRELVY) 50 MG TABS 1-2 tabs po at onset of headache, may repeat dose in 2 hours if not 50% improved.  Max dose in 24h is 200 mg. 15 tablet 1   valACYclovir (VALTREX) 1000 MG tablet 2 tabs po q12h x 2 doses for acute outbreak of sores on lips (Patient not taking: No sig reported) 20 tablet 1   No facility-administered medications prior to visit.    No Known Allergies  ROS As per HPI  PE: Vitals with BMI 07/18/2021 05/03/2020 02/04/2018  Height 6' 1.5" 6' 1.5" 6' 1.5"  Weight 218 lbs 3 oz 215 lbs 3 oz 216 lbs 2 oz  BMI 28.39 28 28.12  Systolic 133 132 161  Diastolic 89 85 73  Pulse 66 68 70  Gen: Alert, well appearing.  Patient is oriented to person, place, time, and situation. AFFECT: pleasant, lucid thought and speech. R eye lower lid hordeolum present, no erythema.  Palpebral and bulbar conjunctiva are without injection.  No eye drainage.  No lid swelling.  EOMI PERRLA. FACE:  no swelling, no asymmetry, no rash, no sensory abnormality.  Tongue midline.  Mouth: lips without lesion/swelling.  Oral mucosa pink and moist. Oropharynx without erythema, exudate, or swelling.  Neck: no LAD, TM, or tenderness.   Neuro: CN 2-12 intact bilaterally, strength 5/5 in proximal and distal upper extremities and lower extremities bilaterally.  No sensory deficits.  No tremor.  No disdiadochokinesis.  No ataxia.  Upper extremity and lower extremity DTRs symmetric.  No pronator drift.  LABS:    Chemistry      Component Value Date/Time   NA 138 06/18/2014 1015   K 4.1 06/18/2014 1015   CL 101 06/18/2014 1015   CO2 27 06/18/2014 1015   BUN 17 06/18/2014 1015   CREATININE 1.0 06/18/2014 1015      Component Value Date/Time   CALCIUM 9.5 06/18/2014 1015   ALKPHOS 88 06/18/2014 1015   AST 16  06/18/2014 1015   ALT 18 06/18/2014 1015   BILITOT 0.6 06/18/2014 1015     Lab Results  Component Value Date   WBC 8.1 02/10/2016   HGB 13.5 02/10/2016   HCT 40.2 02/10/2016   MCV 85.9 02/10/2016   PLT 270.0 02/10/2016   IMPRESSION AND PLAN:  1) R eye hordeolum->seems to have improved.  Will go ahead and treat with e-mycin ointment tid x 5d, +baby shampoo lid soaks qd.  2) Facial neuromuscular spasm: unknown etiology, unknown prognosis. Will r/o electrolyte abnormality. Referral to neurology is best next step->ordered today.  3) Screening for hypercholesterolemia and diabetes: pt is fasting today and requested this-->fasting sugar and lipid panel ordered.    An After Visit Summary was printed and given to the patient.  FOLLOW UP: Return for as needed.  Signed:  Santiago Bumpers, MD           07/18/2021

## 2021-07-18 NOTE — Addendum Note (Signed)
Addended by: Paschal Dopp on: 07/18/2021 03:13 PM   Modules accepted: Orders

## 2021-07-19 LAB — COMPREHENSIVE METABOLIC PANEL
AG Ratio: 1.7 (calc) (ref 1.0–2.5)
ALT: 14 U/L (ref 9–46)
AST: 13 U/L (ref 10–40)
Albumin: 4.7 g/dL (ref 3.6–5.1)
Alkaline phosphatase (APISO): 83 U/L (ref 36–130)
BUN: 12 mg/dL (ref 7–25)
CO2: 23 mmol/L (ref 20–32)
Calcium: 9.9 mg/dL (ref 8.6–10.3)
Chloride: 100 mmol/L (ref 98–110)
Creat: 1.03 mg/dL (ref 0.60–1.29)
Globulin: 2.7 g/dL (calc) (ref 1.9–3.7)
Glucose, Bld: 82 mg/dL (ref 65–99)
Potassium: 4.5 mmol/L (ref 3.5–5.3)
Sodium: 138 mmol/L (ref 135–146)
Total Bilirubin: 0.6 mg/dL (ref 0.2–1.2)
Total Protein: 7.4 g/dL (ref 6.1–8.1)

## 2021-07-19 LAB — CBC WITH DIFFERENTIAL/PLATELET
Absolute Monocytes: 724 cells/uL (ref 200–950)
Basophils Absolute: 28 cells/uL (ref 0–200)
Basophils Relative: 0.3 %
Eosinophils Absolute: 85 cells/uL (ref 15–500)
Eosinophils Relative: 0.9 %
HCT: 44.9 % (ref 38.5–50.0)
Hemoglobin: 15.1 g/dL (ref 13.2–17.1)
Lymphs Abs: 2322 cells/uL (ref 850–3900)
MCH: 30.5 pg (ref 27.0–33.0)
MCHC: 33.6 g/dL (ref 32.0–36.0)
MCV: 90.7 fL (ref 80.0–100.0)
MPV: 10.5 fL (ref 7.5–12.5)
Monocytes Relative: 7.7 %
Neutro Abs: 6242 cells/uL (ref 1500–7800)
Neutrophils Relative %: 66.4 %
Platelets: 276 10*3/uL (ref 140–400)
RBC: 4.95 10*6/uL (ref 4.20–5.80)
RDW: 13.5 % (ref 11.0–15.0)
Total Lymphocyte: 24.7 %
WBC: 9.4 10*3/uL (ref 3.8–10.8)

## 2021-07-19 LAB — TSH: TSH: 0.95 mIU/L (ref 0.40–4.50)

## 2021-07-19 LAB — LIPID PANEL
Cholesterol: 261 mg/dL — ABNORMAL HIGH (ref <200)
HDL: 48 mg/dL (ref >=40)
LDL Cholesterol (Calc): 170 mg/dL (calc) — ABNORMAL HIGH
Non-HDL Cholesterol (Calc): 213 mg/dL (calc) — ABNORMAL HIGH (ref <130)
Total CHOL/HDL Ratio: 5.4 (calc) — ABNORMAL HIGH (ref <5.0)
Triglycerides: 264 mg/dL — ABNORMAL HIGH (ref <150)

## 2021-07-19 LAB — MAGNESIUM: Magnesium: 2.3 mg/dL (ref 1.5–2.5)

## 2021-07-24 ENCOUNTER — Encounter: Payer: Self-pay | Admitting: Neurology

## 2021-09-16 ENCOUNTER — Telehealth: Payer: Self-pay

## 2021-09-16 NOTE — Telephone Encounter (Signed)
Noted  

## 2021-09-16 NOTE — Telephone Encounter (Signed)
FYI, pt has scheduled new pt appt with Dr.Jaffe 11/10 and 11/14 appt with PCP.  [facility] Day - Client TELEPHONE ADVICE RECORD [facility] Patient Name: Dylan Pope Gender: Male DOB: 1976/05/20 Age: 45 Y 8 M 24 D Return Phone Number: [phone] (Primary), [phone] (Secondary) Address: City/ State/ Zip: [place]  [address] Client [facility] Day - Client Client Site [facility] - Day Physician Santiago Bumpers - MD Contact Type Call Who Is Calling Patient / Member / Family / Caregiver Call Type Triage / Clinical Relationship To Patient Self Return Phone Number [phone] (Primary) Chief Complaint NUMBNESS/TINGLING- sudden on one side of the body or face Reason for Call Symptomatic / Request for Health Information Initial Comment Caller states has right side of his jaw is numb along with his cheek and tongue. Patient is also dizzy Translation No Nurse Assessment Nurse: Maisie Fus, RN, Dorisann Frames Date/Time Lamount Cohen Time): 09/15/2021 3:46:23 PM Confirm and document reason for call. If symptomatic, describe symptoms. ---Caller states has right side of his jaw is numb along with his cheek and tongue. Patient is also dizzy, started Oct 29 the the tongue, then jaw area. Does the patient have any new or worsening symptoms? ---Yes Will a triage be completed? ---Yes Related visit to physician within the last 2 weeks? ---No Does the PT have any chronic conditions? (i.e. diabetes, asthma, this includes High risk factors for pregnancy, etc.) ---No Is this a behavioral health or substance abuse call? ---No Guidelines Guideline Title Affirmed Question Affirmed Notes Nurse Date/Time (Eastern Time) Neurologic Deficit [1] Numbness (i.e., loss of sensation) of the face, arm / hand, or leg / foot on one side of the body AND [2] sudden onset AND [3] present now Maisie Fus, RN, Dorisann Frames 09/15/2021 3:47:51 PM Disp. Time  Lamount Cohen Time) Disposition Final User 09/15/2021 3:44:15 PM Send to Urgent [facility] [place], [place] PLEASE NOTE: All timestamps contained within this report are represented as [place] Standard Time. CONFIDENTIALTY NOTICE: This fax transmission is intended only for the addressee. It contains information that is legally privileged, confidential or otherwise protected from use or disclosure. If you are not the intended recipient, you are strictly prohibited from reviewing, disclosing, copying using or disseminating any of this information or taking any action in reliance on or regarding this information. If you have received this fax in error, please notify us immediately by telephone so that we can arrange for its return to [place]. Phone: [phone], Toll-Free: [phone], Fax: [phone] Page: 2 of 2 Call Id: [id number] Disp. Time Lamount Cohen Time) Disposition Final User 09/15/2021 3:59:50 PM 911 Outcome Documentation Maisie Fus, RN, Dorisann Frames Reason: Caller refuses to call 911, he does not feel his symptoms warrant that. He will go to an Urgent Care instead and hopes that they can order a MRI. 09/15/2021 4:00:32 PM Call EMS 911 Now Yes Maisie Fus, RN, Dorisann Frames Caller Disagree/Comply Disagree Caller Understands Yes PreDisposition Go to Urgent Care/Walk-In Clinic Care Advice Given Per Guideline CALL EMS 911 NOW: * Immediate medical attention is needed. You need to hang up and call 911 (or an ambulance). * Triager Discretion: I'll call you back in a few minutes to be sure you were able to reach them. CARE ADVICE given per Neurologic Deficit (Adult) guideline. Comments User: Marton Redwood, RN Date/Time Lamount Cohen Time): 09/15/2021 3:58:00 PM Pt. advised to call 911, but patient does not think his situation warrents that. He is waiting to get into a Neurologist apt. a month out and new Pcp a  ways out. He states he will try to get into a Urgent Care instead. Referrals GO TO FACILITY REFUSED

## 2021-09-17 NOTE — Progress Notes (Signed)
NEUROLOGY CONSULTATION NOTE  Dylan Pope MRN: [id number] DOB: 04-20-1976  Referring provider: Nicoletta Ba, MD Primary care provider: Nicoletta Ba, MD  Reason for consult:  facial nerve spasm  Assessment/Plan:   Right sided hemifacial spasm, facial numbness, and disequilibrium - given possibility of associated cold sores, may be viral infection involving multiple cranial nerves.  Also consider CNS/brain etiology.  Check MRI of brain with and without contrast  Check serum ANA, sed rate, CRP, B12, Lyme, HSV 1&2 panel, SSA/SSB antibodies, ACE Further recommendations pending results. Follow up after testing.   Subjective:  Dylan Pope is a 45 year old male with history of migraines, cluster headaches and OSA on CPAP presents for right sided facial spasm.  History supplemented by referring provider's note.  In August, he developed a stye on his right lower lid.  Since then, his right upper eyelid started twitching.  Over the next 4 weeks, his right eye would start closing spontaneously and the right side of his mouth started drawing upward lasting 15-30 minutes.  He had some dull right perioral numbness but no facial pain, facial swelling or facial weakness.  CMP, magnesium level and TSH were normal.    On 07/10/2021, he noted the right side of his mouth started drawing up for about an hour.  He also noted that his right eye looked a little more narrow/closed.  It happened again the next day.  About 12 days ago, the right side of his tongue, gums, bottom lip and jaw line became numb with tingling.  He noted that he was developing a couple of cold sores.  He went to the dentist and a couple of red spots on roof of mouth and tongue.  He also has burning of the tongue.  He took valacyclovir for a couple of days but stopped because he wasn't sure if it the numbness was a side effect.  No ear pain or change in hearing.  Has not noticed any lesions or rash involving the ear canal.  He also has had  some dizziness/lightheadedness and tends to veer to the right.  For about 2 years, he has had constant numbness and tingling of all his fingertips.  TSH, Mg and electrolytes on 07/18/2021 were normal.  Family history:  Brother has early-onset Parkinson's disease (dx at age 51)   Brother - early-onset PD (16 yo)  PAST MEDICAL HISTORY: Past Medical History:  Diagnosis Date   Cluster headaches 02/27/2010   Long history of HA's.  CT head neg 02/2013.  CT sinuses neg 2014.   Environmental allergies    LOW BACK PAIN 08/15/2009   Migraine syndrome    OSA on CPAP    Dr. Kriste Basque   Restless legs syndrome    Sleep study 2017   TOBACCO USE, QUIT 08/15/2009    PAST SURGICAL HISTORY: Past Surgical History:  Procedure Laterality Date   NASAL SINUS SURGERY  2014    MEDICATIONS: Current Outpatient Medications on File Prior to Visit  Medication Sig Dispense Refill   fluticasone (FLONASE) 50 MCG/ACT nasal spray Place 2 sprays into both nostrils daily.     Multiple Vitamin (MULTIVITAMIN WITH MINERALS) TABS Take 1 tablet by mouth daily.     valACYclovir (VALTREX) 1000 MG tablet 2 tabs po q12h x 2 doses for acute outbreak of sores on lips (Patient not taking: No sig reported) 20 tablet 1   No current facility-administered medications on file prior to visit.    ALLERGIES: No Known Allergies  FAMILY HISTORY:  Family History  Problem Relation Age of Onset   Hypertension Other    Hyperlipidemia Other    Hypertension Father    Hyperlipidemia Father     Objective:  Blood pressure 120/78, pulse 72, height 6\' 2"  (1.88 m), weight 222 lb (100.7 kg), SpO2 95 %. General: No acute distress.  Patient appears well-groomed.   Head:  Normocephalic/atraumatic Eyes:  fundi examined but not visualized Neck: supple, no paraspinal tenderness, full range of motion Back: No paraspinal tenderness Heart: regular rate and rhythm Lungs: Clear to auscultation bilaterally. Vascular: No carotid bruits. Neurological  Exam: Mental status: alert and oriented to person, place, and time, recent and remote memory intact, fund of knowledge intact, attention and concentration intact, speech fluent and not dysarthric, language intact. Cranial nerves: CN I: not tested CN II: pupils equal, round and reactive to light, visual fields intact CN III, IV, VI:  full range of motion, no nystagmus, no ptosis CN V: facial sensation reduced along right V3 dermatome. CN VII: upper and lower face symmetric CN VIII: hearing intact CN IX, X: gag intact, uvula midline CN XI: sternocleidomastoid and trapezius muscles intact CN XII: tongue midline Bulk & Tone: normal, no fasciculations. Motor:  muscle strength 5/5 throughout Sensation:  Pinprick, temperature and vibratory sensation intact. Deep Tendon Reflexes:  2+ throughout,  toes downgoing.   Finger to nose testing:  Without dysmetria.   Heel to shin:  Without dysmetria.   Gait:  Normal station and stride.  Romberg negative.    Thank you for allowing me to take part in the care of this patient.  , DO  CC: Shon Millet, MD

## 2021-09-18 ENCOUNTER — Encounter: Payer: Self-pay | Admitting: Neurology

## 2021-09-18 ENCOUNTER — Other Ambulatory Visit (INDEPENDENT_AMBULATORY_CARE_PROVIDER_SITE_OTHER): Payer: BC Managed Care – PPO

## 2021-09-18 ENCOUNTER — Ambulatory Visit (INDEPENDENT_AMBULATORY_CARE_PROVIDER_SITE_OTHER): Payer: BC Managed Care – PPO | Admitting: Neurology

## 2021-09-18 ENCOUNTER — Other Ambulatory Visit: Payer: Self-pay

## 2021-09-18 VITALS — BP 120/78 | HR 72 | Ht 74.0 in | Wt 222.0 lb

## 2021-09-18 DIAGNOSIS — G5131 Clonic hemifacial spasm, right: Secondary | ICD-10-CM

## 2021-09-18 DIAGNOSIS — R200 Anesthesia of skin: Secondary | ICD-10-CM

## 2021-09-18 DIAGNOSIS — R42 Dizziness and giddiness: Secondary | ICD-10-CM

## 2021-09-18 LAB — SEDIMENTATION RATE: Sed Rate: 11 mm/hr (ref 0–15)

## 2021-09-18 LAB — C-REACTIVE PROTEIN: CRP: <1 mg/dL (ref 0.5–20.0)

## 2021-09-18 LAB — VITAMIN B12: Vitamin B-12: 178 pg/mL — ABNORMAL LOW (ref 211–911)

## 2021-09-18 NOTE — Addendum Note (Signed)
Addended by: Leida Lauth on: 09/18/2021 09:04 AM   Modules accepted: Orders

## 2021-09-18 NOTE — Patient Instructions (Signed)
Check MRI of brain with and without contrast Check blood work:  ANA, sed rate, CRP, B12, TSH, Lyme, HSV panel, SSA/SSB antibodies, ACE Further recommendations, such as possibility of spinal tap, pending above results Follow up after testing.

## 2021-09-19 LAB — ANGIOTENSIN CONVERTING ENZYME: Angiotensin-Converting Enzyme: 11 U/L (ref 9–67)

## 2021-09-19 LAB — SPECIMEN STATUS REPORT

## 2021-09-19 LAB — SJOGREN'S SYNDROME ANTIBODS(SSA + SSB)
SSA (Ro) (ENA) Antibody, IgG: <1 AI
SSB (La) (ENA) Antibody, IgG: <1 AI

## 2021-09-19 LAB — HSV(HERPES SIMPLEX VRS) I + II AB-IGG
HAV 1 IGG,TYPE SPECIFIC AB: 16.3 index — ABNORMAL HIGH
HSV 2 IGG,TYPE SPECIFIC AB: <0.9 index

## 2021-09-19 LAB — ANA W/REFLEX: Anti Nuclear Antibody (ANA): NEGATIVE

## 2021-09-22 ENCOUNTER — Ambulatory Visit: Payer: BC Managed Care – PPO | Admitting: Family Medicine

## 2021-09-22 ENCOUNTER — Telehealth: Payer: Self-pay | Admitting: Neurology

## 2021-09-22 NOTE — Telephone Encounter (Signed)
Patient called and left a voice mail requesting a call back with his recent lab results if they are ready.

## 2021-09-23 LAB — LYME DISEASE, WESTERN BLOT
IgG P18 Ab.: ABSENT
IgG P23 Ab.: ABSENT
IgG P28 Ab.: ABSENT
IgG P30 Ab.: ABSENT
IgG P39 Ab.: ABSENT
IgG P41 Ab.: ABSENT
IgG P45 Ab.: ABSENT
IgG P58 Ab.: ABSENT
IgG P66 Ab.: ABSENT
IgG P93 Ab.: ABSENT
IgM P39 Ab.: ABSENT
IgM P41 Ab.: ABSENT
Lyme IgG Wb: NEGATIVE
Lyme IgM Wb: NEGATIVE

## 2021-09-23 NOTE — Telephone Encounter (Signed)
See results note. 

## 2021-09-23 NOTE — Progress Notes (Signed)
LMOVM to call the office back.

## 2021-10-12 ENCOUNTER — Ambulatory Visit
Admission: RE | Admit: 2021-10-12 | Discharge: 2021-10-12 | Disposition: A | Discharge status: Home / Self Care | Payer: BC Managed Care – PPO | Source: Ambulatory Visit | Attending: Neurology | Admitting: Neurology

## 2021-10-12 DIAGNOSIS — R200 Anesthesia of skin: Secondary | ICD-10-CM

## 2021-10-12 DIAGNOSIS — G5131 Clonic hemifacial spasm, right: Secondary | ICD-10-CM

## 2021-10-12 DIAGNOSIS — R42 Dizziness and giddiness: Secondary | ICD-10-CM

## 2021-10-12 DIAGNOSIS — R29810 Facial weakness: Secondary | ICD-10-CM | POA: Diagnosis not present

## 2021-10-12 MED ORDER — GADOBENATE DIMEGLUMINE 529 MG/ML IV SOLN
20.0000 mL | Freq: Once | INTRAVENOUS | Status: AC | PRN
  Administered 2021-10-12: 20 mL via INTRAVENOUS

## 2021-10-15 ENCOUNTER — Telehealth: Payer: Self-pay

## 2021-10-15 DIAGNOSIS — G5131 Clonic hemifacial spasm, right: Secondary | ICD-10-CM

## 2021-10-15 DIAGNOSIS — R200 Anesthesia of skin: Secondary | ICD-10-CM

## 2021-10-15 DIAGNOSIS — R42 Dizziness and giddiness: Secondary | ICD-10-CM

## 2021-10-15 NOTE — Telephone Encounter (Signed)
-----   Message from Drema Dallas, DO sent at 10/15/2021  6:40 AM EST ----- The imaging we have of the brain is unremarkable.  However, they only checked the brain at the level of the trigeminal nerve.  I want an MRI of the entire brain with and without contrast

## 2021-10-15 NOTE — Telephone Encounter (Signed)
Telephone call to [facility], Rep Strawn answered. Please have someone call to see if the patient has to pay for another MRI Of Brain W/WO Contrast. The MRI should've been the whole brain and not Just the Trigeminal Nerve. There were no notes to state focus on Trigeminal Nerve Protocol.   Per Geraldine Contras She will have Clinical manager get back to [place].

## 2021-10-20 NOTE — Telephone Encounter (Signed)
Spoke to [facility], Patient scheduled for 11/05/21 at 1:10 pm. Patient to arrive at 12 :45 pm.  Asked rep to please have someone to call Me or Dylan Pope in regards to payment options or if he will have to pay  for the second MRI since the first wasn't do as we ordered.    Per Note on Mri  Dylan Pope, RT on 10/12/2021  1:14 PM  MR Brain W/WO Trigeminal protocol 34ml multihance Right side facial and tongue numbness  Numbness in right fingertips Loss of balance Dizziness/ fatigue  x 2+ months, NKI No sx, no hx of ca  We did not ask for this to be done.

## 2021-10-20 NOTE — Telephone Encounter (Signed)
-----   Message from Drema Dallas, DO sent at 10/15/2021  6:40 AM EST ----- The imaging we have of the brain is unremarkable.  However, they only checked the brain at the level of the trigeminal nerve.  I want an MRI of the entire brain with and without contrast

## 2021-10-21 ENCOUNTER — Ambulatory Visit: Payer: BC Managed Care – PPO | Admitting: Neurology

## 2021-10-24 NOTE — Telephone Encounter (Signed)
Message left by Rinaldo Cloud, There is no charge for Patients MRI scheduled for 11/05/21.  Pt advised.

## 2021-11-05 ENCOUNTER — Other Ambulatory Visit: Payer: Self-pay

## 2021-11-05 ENCOUNTER — Ambulatory Visit
Admission: RE | Admit: 2021-11-05 | Discharge: 2021-11-05 | Disposition: A | Discharge status: Home / Self Care | Payer: Self-pay | Source: Ambulatory Visit | Attending: Neurology | Admitting: Neurology

## 2021-11-05 DIAGNOSIS — G5131 Clonic hemifacial spasm, right: Secondary | ICD-10-CM

## 2021-11-05 DIAGNOSIS — R200 Anesthesia of skin: Secondary | ICD-10-CM

## 2021-11-05 DIAGNOSIS — R42 Dizziness and giddiness: Secondary | ICD-10-CM

## 2021-11-05 DIAGNOSIS — G5139 Clonic hemifacial spasm, unspecified: Secondary | ICD-10-CM | POA: Diagnosis not present

## 2021-11-05 MED ORDER — GADOBENATE DIMEGLUMINE 529 MG/ML IV SOLN
20.0000 mL | Freq: Once | INTRAVENOUS | Status: AC | PRN
  Administered 2021-11-05: 14:00:00 20 mL via INTRAVENOUS

## 2021-11-06 ENCOUNTER — Telehealth: Payer: Self-pay

## 2021-11-06 DIAGNOSIS — G379 Demyelinating disease of central nervous system, unspecified: Secondary | ICD-10-CM

## 2021-11-06 NOTE — Telephone Encounter (Signed)
-----   Message from Drema Dallas, DO sent at 11/06/2021  1:12 PM EST ----- I called and spoke with Mr. Loden regarding MRI results.  Findings are suspicious for MS.  We will perform the following tests: 1. MRI of cervical and thoracic spine with and without contrast 2. Lumbar puncture testing CSF for cell count, protein, glucose, gram stain and culture, cytology, oligoclonal bands, IgG index

## 2021-11-06 NOTE — Telephone Encounter (Signed)
Orders added and faxed to [facility].

## 2021-11-15 ENCOUNTER — Ambulatory Visit: Payer: BC Managed Care – PPO

## 2021-11-15 DIAGNOSIS — B349 Viral infection, unspecified: Secondary | ICD-10-CM | POA: Diagnosis not present

## 2021-11-17 ENCOUNTER — Telehealth: Payer: Self-pay | Admitting: Neurology

## 2021-11-17 NOTE — Telephone Encounter (Signed)
Pt's wife called in stating the pt has started taking prednisone and she wants to make sure it will be ok for him to do the lumbar puncture on 11/19/21?

## 2021-11-18 NOTE — Telephone Encounter (Signed)
I advised patient to hold lumbar until finished prednisone, per [facility]

## 2021-11-19 ENCOUNTER — Inpatient Hospital Stay
Admission: RE | Admit: 2021-11-19 | Discharge: 2021-11-19 | Disposition: A | Discharge status: Home / Self Care | Payer: BC Managed Care – PPO | Source: Ambulatory Visit | Attending: Neurology | Admitting: Neurology

## 2021-11-19 NOTE — Discharge Instructions (Signed)

## 2021-11-25 ENCOUNTER — Ambulatory Visit
Admission: RE | Admit: 2021-11-25 | Discharge: 2021-11-25 | Disposition: A | Discharge status: Home / Self Care | Payer: BC Managed Care – PPO | Source: Ambulatory Visit | Attending: Neurology | Admitting: Neurology

## 2021-11-25 ENCOUNTER — Other Ambulatory Visit: Payer: Self-pay | Admitting: Neurology

## 2021-11-25 ENCOUNTER — Other Ambulatory Visit: Payer: Self-pay

## 2021-11-25 DIAGNOSIS — G379 Demyelinating disease of central nervous system, unspecified: Secondary | ICD-10-CM

## 2021-11-25 DIAGNOSIS — M50323 Other cervical disc degeneration at C6-C7 level: Secondary | ICD-10-CM | POA: Diagnosis not present

## 2021-11-25 DIAGNOSIS — R200 Anesthesia of skin: Secondary | ICD-10-CM | POA: Diagnosis not present

## 2021-11-25 DIAGNOSIS — M5124 Other intervertebral disc displacement, thoracic region: Secondary | ICD-10-CM | POA: Diagnosis not present

## 2021-11-25 MED ORDER — GADOBENATE DIMEGLUMINE 529 MG/ML IV SOLN
20.0000 mL | Freq: Once | INTRAVENOUS | Status: AC | PRN
  Administered 2021-11-25: 20 mL via INTRAVENOUS

## 2021-11-26 NOTE — Progress Notes (Signed)
NEUROLOGY FOLLOW UP OFFICE NOTE  Dylan Pope [id number]  Assessment/Plan:   Relapsing-remitting multiple sclerosis - I think diagnosis is confirmed via MRIs.  Therefore will cancel LP.  We discussed treatment options.  Plan would be to start Tysabri if JCV antibody is negative (or with low index).  If positive, will start Ocrevus or Kesimpta.  Check CBC with diff, CMP, vit D, JCV ab and index, quantitative immunoglobulin panel, TB, hepatitis B panel, B12.  Continue B12 daily Repeat MRI of brain, cervical/thoracic spine with and without contrast in 6 months. Follow up in 6 months (after repeat imaging).  Total time spent reviewing lab and imaging results and discussing diagnosis with patient:  48 minutes.  Subjective:  Dylan Pope is a 46 year old male with history of migraines, cluster headaches and OSA on CPAP follows up for right sided facial spasm. He is accompanied by his wife.  UPDATE: Labs from 09/18/2021 included negative ANA, sed rate 11, negative CRP, ACE 11, negative Lyme, negative HSV 1&2, negative SSA/SSB antibodies.  B12 was low at 178.  He was advised to start supplement.   MRI of brain with and without contrast on 11/05/2021 personally reviewed showed small none-enhancing supratentorial and infratentorial hyperintensities, including a lesion in the relative proximity of the right facial nerve projection.  He had follow up MRI of the cervical and thoracic spine with and without contrast on 11/25/2021 personally reviewed showed nonenhancing lesions within the cervical and thoracic spinal cord.    HISTORY:  In August 2022, he developed a stye on his right lower lid.  Since then, his right upper eyelid started twitching.  Over the next 4 weeks, his right eye would start closing spontaneously and the right side of his mouth started drawing upward lasting 15-30 minutes.  He had some dull right perioral numbness but no facial pain, facial swelling or facial weakness.  CMP,  magnesium level and TSH were normal.     On 07/10/2021, he noted the right side of his mouth started drawing up for about an hour.  He also noted that his right eye looked a little more narrow/closed.  It happened again the next day.  About 12 days ago, the right side of his tongue, gums, bottom lip and jaw line became numb with tingling.  He noted that he was developing a couple of cold sores.  He went to the dentist and a couple of red spots on roof of mouth and tongue.  He also has burning of the tongue.  He took valacyclovir for a couple of days but stopped because he wasn't sure if it the numbness was a side effect.  No ear pain or change in hearing.  Has not noticed any lesions or rash involving the ear canal.  He also has had some dizziness/lightheadedness and tends to veer to the right.  Since around 2020, he has had constant numbness and tingling of all his fingertips.  He feels a shooting pain down his spine and into his arms when he flexes his neck.  TSH, Mg and electrolytes on 07/18/2021 were normal.  Family history:  Brother has early-onset Parkinson's disease (dx at age 53)   Brother - early-onset PD (45 yo)  PAST MEDICAL HISTORY: Past Medical History:  Diagnosis Date   Cluster headaches 02/27/2010   Long history of HA's.  CT head neg 02/2013.  CT sinuses neg 2014.   Environmental allergies    LOW BACK PAIN 08/15/2009   Migraine syndrome  OSA on CPAP    Dr. Kriste Basque   Restless legs syndrome    Sleep study 2017   TOBACCO USE, QUIT 08/15/2009    MEDICATIONS: Current Outpatient Medications on File Prior to Visit  Medication Sig Dispense Refill   acetaminophen (TYLENOL) 500 MG tablet Take 500 mg by mouth every 6 (six) hours as needed.     fluticasone (FLONASE) 50 MCG/ACT nasal spray Place 2 sprays into both nostrils daily.     Multiple Vitamin (MULTIVITAMIN WITH MINERALS) TABS Take 1 tablet by mouth daily.     valACYclovir (VALTREX) 1000 MG tablet 2 tabs po q12h x 2 doses for acute  outbreak of sores on lips 20 tablet 1   No current facility-administered medications on file prior to visit.    ALLERGIES: No Known Allergies  FAMILY HISTORY: Family History  Problem Relation Age of Onset   Hypertension Other    Hyperlipidemia Other    Hypertension Father    Hyperlipidemia Father       Objective:  Blood pressure 122/83, pulse 76, height 6\' 2"  (1.88 m), weight 218 lb 12.8 oz (99.2 kg), SpO2 97 %. General: No acute distress.  Patient appears well-groomed.      , DO  CC: Shon Millet, MD

## 2021-11-27 ENCOUNTER — Other Ambulatory Visit: Payer: BC Managed Care – PPO

## 2021-11-27 ENCOUNTER — Other Ambulatory Visit: Payer: Self-pay

## 2021-11-27 ENCOUNTER — Ambulatory Visit: Payer: BC Managed Care – PPO | Admitting: Neurology

## 2021-11-27 ENCOUNTER — Encounter: Payer: Self-pay | Admitting: Neurology

## 2021-11-27 VITALS — BP 122/83 | HR 76 | Ht 74.0 in | Wt 218.8 lb

## 2021-11-27 DIAGNOSIS — G35 Multiple sclerosis: Secondary | ICD-10-CM

## 2021-11-27 LAB — COMPREHENSIVE METABOLIC PANEL
ALT: 24 U/L (ref 0–53)
AST: 17 U/L (ref 0–37)
Albumin: 4.4 g/dL (ref 3.5–5.2)
Alkaline Phosphatase: 88 U/L (ref 39–117)
BUN: 11 mg/dL (ref 6–23)
CO2: 30 mEq/L (ref 19–32)
Calcium: 9.5 mg/dL (ref 8.4–10.5)
Chloride: 99 mEq/L (ref 96–112)
Creatinine, Ser: 0.97 mg/dL (ref 0.40–1.50)
GFR: 94.66 mL/min (ref >60.00)
Glucose, Bld: 113 mg/dL — ABNORMAL HIGH (ref 70–99)
Potassium: 4.2 mEq/L (ref 3.5–5.1)
Sodium: 138 mEq/L (ref 135–145)
Total Bilirubin: 0.5 mg/dL (ref 0.2–1.2)
Total Protein: 7.2 g/dL (ref 6.0–8.3)

## 2021-11-27 LAB — VITAMIN B12: Vitamin B-12: 372 pg/mL (ref 211–911)

## 2021-11-27 LAB — VITAMIN D 25 HYDROXY (VIT D DEFICIENCY, FRACTURES): VITD: 15.43 ng/mL — ABNORMAL LOW (ref 30.00–100.00)

## 2021-11-27 NOTE — Patient Instructions (Addendum)
Check CBC with diff, CMP, vit D, JCV ab and index, quantitative immunoglobulin panel, TB, hepatitis B panel, B12. Your provider has requested that you have labwork completed today. Please go to [facility] ([id number]) on the second floor of this building before leaving the office today. You do not need to check in. If you are not called within 15 minutes please check with the front desk.   Further recommendations pending results Repeat MRI of brain and spinal cord with and without contrast in 6 months and follow up with me afterwards.   We have sent a referral to [facility] for your MRI and they will call you directly to schedule your appointment. They are located at [address] [facility]. If you need to contact them directly please call [phone].

## 2021-11-28 LAB — CBC WITH DIFFERENTIAL
Basophils Absolute: 0.1 10*3/uL (ref 0.0–0.2)
Basos: 1 %
EOS (ABSOLUTE): 0.1 10*3/uL (ref 0.0–0.4)
Eos: 1 %
Hematocrit: 45 % (ref 37.5–51.0)
Hemoglobin: 15.1 g/dL (ref 13.0–17.7)
Immature Grans (Abs): 0 10*3/uL (ref 0.0–0.1)
Immature Granulocytes: 0 %
Lymphocytes Absolute: 2.5 10*3/uL (ref 0.7–3.1)
Lymphs: 21 %
MCH: 30.6 pg (ref 26.6–33.0)
MCHC: 33.6 g/dL (ref 31.5–35.7)
MCV: 91 fL (ref 79–97)
Monocytes Absolute: 0.9 10*3/uL (ref 0.1–0.9)
Monocytes: 7 %
Neutrophils Absolute: 8.3 10*3/uL — ABNORMAL HIGH (ref 1.4–7.0)
Neutrophils: 70 %
RBC: 4.94 x10E6/uL (ref 4.14–5.80)
RDW: 13.4 % (ref 11.6–15.4)
WBC: 12 10*3/uL — ABNORMAL HIGH (ref 3.4–10.8)

## 2021-11-28 LAB — SPECIMEN STATUS REPORT

## 2021-12-02 NOTE — Progress Notes (Signed)
Patient advised of his lab results.

## 2021-12-04 ENCOUNTER — Other Ambulatory Visit: Payer: BC Managed Care – PPO

## 2021-12-04 ENCOUNTER — Telehealth: Payer: Self-pay | Admitting: Neurology

## 2021-12-04 LAB — QUANTIFERON-TB GOLD PLUS
Mitogen-NIL: 6.88 IU/mL
NIL: 0.04 IU/mL
QuantiFERON-TB Gold Plus: NEGATIVE
TB1-NIL: 0 IU/mL
TB2-NIL: 0 IU/mL

## 2021-12-04 LAB — ACUTE HEP PANEL AND HEP B SURFACE AB
HEPATITIS C ANTIBODY REFILL$(REFL): NONREACTIVE
Hep A IgM: NONREACTIVE
Hep B C IgM: NONREACTIVE
Hepatitis B Surface Ag: NONREACTIVE
SIGNAL TO CUT-OFF: 0.09 (ref <1.00)

## 2021-12-04 LAB — IGG, IGA, IGM
IgG (Immunoglobin G), Serum: 893 mg/dL (ref 600–1640)
IgM, Serum: 133 mg/dL (ref 50–300)
Immunoglobulin A: 245 mg/dL (ref 47–310)

## 2021-12-04 LAB — STRATIFY JCV AB (W/ INDEX) W/ RFLX
Index Value: 0.77 — ABNORMAL HIGH
Stratify JCV (TM) Ab w/Reflex Inhibition: POSITIVE — AB

## 2021-12-04 LAB — REFLEX TIQ

## 2021-12-04 NOTE — Telephone Encounter (Signed)
Patient saw his results in [place], and it tested positive. Hes calling to speak with nurse about them.

## 2021-12-04 NOTE — Telephone Encounter (Signed)
Pt advised of  dr.Jaffe, recommend starting Ocrevus or Kesimpta.  Efficacy of both medications is comparable.  Ocrevus is an IV infusion that he receives every 6 months.  Kesimpta is a self injection every 4 weeks.  Both are good medications.  Does he have a preference?  Patient will give [place] a call later after he research the two medications.

## 2021-12-08 ENCOUNTER — Encounter: Payer: Self-pay | Admitting: Family Medicine

## 2021-12-08 DIAGNOSIS — G35 Multiple sclerosis: Secondary | ICD-10-CM

## 2021-12-08 NOTE — Telephone Encounter (Signed)
Referral ordered as per pt request. 

## 2021-12-08 NOTE — Telephone Encounter (Signed)
Please advise if ok for referral 

## 2022-01-15 DIAGNOSIS — G35 Multiple sclerosis: Secondary | ICD-10-CM | POA: Diagnosis not present

## 2022-01-23 ENCOUNTER — Encounter: Payer: Self-pay | Admitting: Family Medicine

## 2022-01-23 MED ORDER — VALACYCLOVIR HCL 1 G PO TABS: ORAL_TABLET | ORAL | 2 refills | Status: DC

## 2022-01-23 NOTE — Telephone Encounter (Signed)
Last refill was 05/19/18(20,1). Pt was last seen 07/18/21. ? ?Med pending. Please fill, if appropriate. ? ?

## 2022-03-06 ENCOUNTER — Encounter: Payer: Self-pay | Admitting: Family Medicine

## 2022-03-06 ENCOUNTER — Ambulatory Visit (INDEPENDENT_AMBULATORY_CARE_PROVIDER_SITE_OTHER): Payer: BC Managed Care – PPO | Admitting: Family Medicine

## 2022-03-06 VITALS — BP 111/75 | HR 79 | Temp 98.1°F | Ht 74.0 in | Wt 218.0 lb

## 2022-03-06 DIAGNOSIS — E538 Deficiency of other specified B group vitamins: Secondary | ICD-10-CM | POA: Diagnosis not present

## 2022-03-06 DIAGNOSIS — R6882 Decreased libido: Secondary | ICD-10-CM | POA: Diagnosis not present

## 2022-03-06 DIAGNOSIS — R5383 Other fatigue: Secondary | ICD-10-CM

## 2022-03-06 DIAGNOSIS — R202 Paresthesia of skin: Secondary | ICD-10-CM | POA: Diagnosis not present

## 2022-03-06 DIAGNOSIS — G43909 Migraine, unspecified, not intractable, without status migrainosus: Secondary | ICD-10-CM

## 2022-03-06 MED ORDER — HYDROCODONE-ACETAMINOPHEN 5-325 MG PO TABS: ORAL_TABLET | ORAL | 0 refills | Status: DC

## 2022-03-06 NOTE — Progress Notes (Signed)
OFFICE VISIT  03/06/2022  CC:  Chief Complaint  Patient presents with   Headache    X 1 weeh; used the last of the ubrelvy and hydrocodone rx prescribed.    Fatigue    No stamina, possibly ongoing for the past 2 months, noticeable. Recent dx of MS    Hand Pain    Bilat; numbness of fingers ongoing since 2020, tingling sensation, achy and uncomfortable sensation.     Patient is a 46 y.o. male who presents for f/u migraine HAs, bilat hand paresthesias, fatigue.  HPI: Vague paresthesias sensation in both hands lately, waxes and wanes. No feet sx's.   Hx of B12 def 09/2021, recheck B12 after oral supp was 372 in Jan 2023.  Libido signif dec, low energy level, +sleep disruption.  Hx been dx'd with MS in the last 6 mo-->intermittent dizziness, R facial and tongue paralysis, lingering fatigue.  Sx's not present currently.  Was rx'd dimethyl fumarate but not taking (Dr. Macarthur Critchley, [facility] neurolog[facility]) b/c coverage denied.  Has HA in the last few days, migraine-type.  Reports <3 migraines/mo lately, though.  Bernita Raisin has helped about 50% of the time.  Uses vicodin prn sparingly.  PMP AWARE reviewed today: most recent rx for vicodin was filled 05/03/20, # 30, rx by me. No red flags.  ROS as above, plus--> no fevers, no CP, no SOB, no wheezing, no cough, no rashes, no melena/hematochezia.  No polyuria or polydipsia.  No myalgias or arthralgias.  No recent focal weakness, no tremors.  No acute vision or hearing abnormalities.  No dysuria or unusual/new urinary urgency or frequency.  No recent changes in lower legs. No n/v/d or abd pain.  No palpitations.    Past Medical History:  Diagnosis Date   Cluster headaches 02/27/2010   Long history of HA's.  CT head neg 02/2013.  CT sinuses neg 2014.   Environmental allergies    LOW BACK PAIN 08/15/2009   Migraine syndrome    Multiple sclerosis (HCC)    relapsing-remitting->initial dx Dr. Everlena Cooper 10/2021.  2nd opinion Dr. Macarthur Critchley 11/2021.   OSA on CPAP    Dr.  Kriste Basque   Restless legs syndrome    Sleep study 2017   TOBACCO USE, QUIT 08/15/2009    Past Surgical History:  Procedure Laterality Date   NASAL SINUS SURGERY  2014    Outpatient Medications Prior to Visit  Medication Sig Dispense Refill   acetaminophen (TYLENOL) 500 MG tablet Take 500 mg by mouth every 6 (six) hours as needed.     baclofen (LIORESAL) 10 MG tablet Take 10 mg by mouth daily as needed.     Cholecalciferol (VITAMIN D-3 PO) Take 1,000 Units by mouth daily.     Cyanocobalamin (VITAMIN B12 PO) Take 5,000 Units by mouth daily.     fluticasone (FLONASE) 50 MCG/ACT nasal spray Place 2 sprays into both nostrils daily.     Multiple Vitamin (MULTIVITAMIN WITH MINERALS) TABS Take 1 tablet by mouth daily.     Dimethyl Fumarate Starter Pack 120 & 240 MG MISC Take by mouth. (Patient not taking: Reported on 03/06/2022)     valACYclovir (VALTREX) 1000 MG tablet 2 tabs po q12h x 2 doses for acute outbreak of sores on lips (Patient not taking: Reported on 03/06/2022) 20 tablet 2   No facility-administered medications prior to visit.    No Known Allergies  ROS As per HPI  PE:    03/06/2022    1:51 PM 11/27/2021   11:24 AM 09/18/2021  8:31 AM  Vitals with BMI  Height 6\' 2"  6\' 2"  6\' 2"   Weight 218 lbs 218 lbs 13 oz 222 lbs  BMI 27.98 28.08 28.49  Systolic 111 122  Diastolic 75 83 78  Pulse 79 76 72     Physical Exam  Gen: Alert, well appearing.  Patient is oriented to person, place, time, and situation. AFFECT: pleasant, lucid thought and speech. HANDS: color pink, radial and ulnar pulses 2+ bilat. Warm. Sensation intact. Phalen's and tinel's neg.  LABS:  Last CBC Lab Results  Component Value Date   WBC 12.0 (H) 11/27/2021   HGB 15.1 11/27/2021   HCT 45.0 11/27/2021   MCV 91 11/27/2021   MCH 30.6 11/27/2021   RDW 13.4 11/27/2021   PLT 276 07/18/2021   Last metabolic panel Lab Results  Component Value Date   GLUCOSE 113 (H) 11/27/2021   NA 138  11/27/2021   K 4.2 11/27/2021   CL 99 11/27/2021   CO2 30 11/27/2021   BUN 11 11/27/2021   CREATININE 0.97 11/27/2021   CALCIUM 9.5 11/27/2021   PROT 7.2 11/27/2021   ALBUMIN 4.4 11/27/2021   BILITOT 0.5 11/27/2021   ALKPHOS 88 11/27/2021   AST 17 11/27/2021   ALT 24 11/27/2021   Last thyroid functions Lab Results  Component Value Date   TSH 0.95 07/18/2021   T3TOTAL 73.1 (L) 03/30/2014   Lab Results  Component Value Date   ESRSEDRATE 11 09/18/2021   Lab Results  Component Value Date   CRP <1.0 09/18/2021   Lab Results  Component Value Date   VITAMINB12 372 11/27/2021   IMPRESSION AND PLAN:  1) Paresthesias hands: question CTS, ? Ongoing B12 def but pt on 5000 mcg b12 daily otc. Reassured.  Obs. Recheck B12 level today.  2) Dec libido and energy. Testost level today.  3) Fatigue. Question d/t MS. Check CBC and CMET.  4) MS, dx'd 2022. No meds currently.  Followed by Dr. 13/08/2021 with [facility] neurolog[facility].  5) Migraine syndrome: doing pretty well with ubrelvy as abortive + vicodin prn when this med does not help sufficiently. RFs today of both.  An After Visit Summary was printed and given to the patient.  FOLLOW UP: 6 mo  Signed:  2023, MD           03/06/2022

## 2022-03-07 LAB — CBC WITH DIFFERENTIAL/PLATELET
Absolute Monocytes: 941 cells/uL (ref 200–950)
Basophils Absolute: 56 cells/uL (ref 0–200)
Basophils Relative: 0.5 %
Eosinophils Absolute: 146 cells/uL (ref 15–500)
Eosinophils Relative: 1.3 %
HCT: 43.8 % (ref 38.5–50.0)
Hemoglobin: 14.9 g/dL (ref 13.2–17.1)
Lymphs Abs: 2475 cells/uL (ref 850–3900)
MCH: 31.3 pg (ref 27.0–33.0)
MCHC: 34 g/dL (ref 32.0–36.0)
MCV: 92 fL (ref 80.0–100.0)
MPV: 10.3 fL (ref 7.5–12.5)
Monocytes Relative: 8.4 %
Neutro Abs: 7582 cells/uL (ref 1500–7800)
Neutrophils Relative %: 67.7 %
Platelets: 316 10*3/uL (ref 140–400)
RBC: 4.76 10*6/uL (ref 4.20–5.80)
RDW: 13.3 % (ref 11.0–15.0)
Total Lymphocyte: 22.1 %
WBC: 11.2 10*3/uL — ABNORMAL HIGH (ref 3.8–10.8)

## 2022-03-07 LAB — FOLATE: Folate: >24 ng/mL

## 2022-03-07 LAB — TSH: TSH: 1.42 mIU/L (ref 0.40–4.50)

## 2022-03-07 LAB — TESTOSTERONE: Testosterone: 95 ng/dL — ABNORMAL LOW (ref 250–827)

## 2022-03-07 LAB — VITAMIN B12: Vitamin B-12: 1180 pg/mL — ABNORMAL HIGH (ref 200–1100)

## 2022-03-09 ENCOUNTER — Other Ambulatory Visit: Payer: Self-pay | Admitting: Family Medicine

## 2022-03-09 DIAGNOSIS — R7989 Other specified abnormal findings of blood chemistry: Secondary | ICD-10-CM

## 2022-03-13 ENCOUNTER — Ambulatory Visit (INDEPENDENT_AMBULATORY_CARE_PROVIDER_SITE_OTHER): Payer: BC Managed Care – PPO

## 2022-03-13 DIAGNOSIS — R7989 Other specified abnormal findings of blood chemistry: Secondary | ICD-10-CM | POA: Diagnosis not present

## 2022-03-13 LAB — TESTOSTERONE: Testosterone: 197.81 ng/dL — ABNORMAL LOW (ref 300.00–890.00)

## 2022-03-13 LAB — LUTEINIZING HORMONE: LH: 3.28 m[IU]/mL (ref 1.50–9.30)

## 2022-03-14 LAB — PROLACTIN: Prolactin: 5.2 ng/mL (ref 2.0–18.0)

## 2022-03-17 ENCOUNTER — Other Ambulatory Visit: Payer: Self-pay | Admitting: Family Medicine

## 2022-03-17 MED ORDER — TESTOSTERONE CYPIONATE 200 MG/ML IJ SOLN
INTRAMUSCULAR | 1 refills | Status: DC
Start: 2022-03-17 — End: 2023-05-28

## 2022-04-14 ENCOUNTER — Ambulatory Visit: Payer: BC Managed Care – PPO

## 2022-04-16 ENCOUNTER — Ambulatory Visit (INDEPENDENT_AMBULATORY_CARE_PROVIDER_SITE_OTHER): Payer: BC Managed Care – PPO

## 2022-04-16 DIAGNOSIS — R7989 Other specified abnormal findings of blood chemistry: Secondary | ICD-10-CM | POA: Diagnosis not present

## 2022-04-16 MED ORDER — TESTOSTERONE CYPIONATE 200 MG/ML IM SOLN
200.0000 mg | INTRAMUSCULAR | Status: DC
  Administered 2022-04-16 – 2022-04-29 (×2): 200 mg via INTRAMUSCULAR

## 2022-04-16 NOTE — Progress Notes (Signed)
Per orders of Dr. Milinda Cave, pt is here for 1st Testosterone injection of  . pt received Testosterone injection in right upper outer glute at 09:35 am. Given by [place] L. CMA/CPT. Pt tolerated Testosterone injection well. Pt will return in 2 weeks for next injection

## 2022-04-28 ENCOUNTER — Ambulatory Visit: Payer: BC Managed Care – PPO | Admitting: Neurology

## 2022-04-29 ENCOUNTER — Ambulatory Visit (INDEPENDENT_AMBULATORY_CARE_PROVIDER_SITE_OTHER): Payer: BC Managed Care – PPO

## 2022-04-29 DIAGNOSIS — R7989 Other specified abnormal findings of blood chemistry: Secondary | ICD-10-CM

## 2022-04-29 NOTE — Progress Notes (Signed)
Per orders of Dr. Milinda Cave, pt is here for 2nd Testosterone injection. pt received 2nd Testostroe injection in Left upper outer quadrant of the glute at 01:45 pm . Given by [place] L. CMA/CPT. Pt tolerated 2nd dose Testosterone injection well. Pt is aware he will should in 2 weeks for  re-draw on testosterone to evaluate his levels.

## 2022-05-14 ENCOUNTER — Ambulatory Visit: Payer: BC Managed Care – PPO

## 2022-05-21 DIAGNOSIS — G35 Multiple sclerosis: Secondary | ICD-10-CM | POA: Diagnosis not present

## 2022-06-11 DIAGNOSIS — G35 Multiple sclerosis: Secondary | ICD-10-CM | POA: Diagnosis not present

## 2022-06-25 DIAGNOSIS — G35 Multiple sclerosis: Secondary | ICD-10-CM | POA: Diagnosis not present

## 2022-08-17 ENCOUNTER — Ambulatory Visit: Payer: BC Managed Care – PPO | Admitting: Family Medicine

## 2022-08-17 NOTE — Progress Notes (Deleted)
OFFICE VISIT  08/17/2022  CC: No chief complaint on file.   Patient is a 46 y.o. male who presents for low back pain.  HPI:  ***   Past Medical History:  Diagnosis Date   Cluster headaches 02/27/2010   Long history of HA's.  CT head neg 02/2013.  CT sinuses neg 2014.   Environmental allergies    LOW BACK PAIN 08/15/2009   Migraine syndrome    Multiple sclerosis (HCC)    relapsing-remitting->initial dx Dr. Everlena Cooper 10/2021.  2nd opinion Dr. Macarthur Critchley 11/2021.   OSA on CPAP    Dr. Kriste Basque   Restless legs syndrome    Sleep study 2017   TOBACCO USE, QUIT 08/15/2009    Past Surgical History:  Procedure Laterality Date   NASAL SINUS SURGERY  2014    Outpatient Medications Prior to Visit  Medication Sig Dispense Refill   acetaminophen (TYLENOL) 500 MG tablet Take 500 mg by mouth every 6 (six) hours as needed.     baclofen (LIORESAL) 10 MG tablet Take 10 mg by mouth daily as needed.     Cholecalciferol (VITAMIN D-3 PO) Take 1,000 Units by mouth daily.     Cyanocobalamin (VITAMIN B12 PO) Take 5,000 Units by mouth daily.     Dimethyl Fumarate Starter Pack 120 & 240 MG MISC Take by mouth. (Patient not taking: Reported on 03/06/2022)     fluticasone (FLONASE) 50 MCG/ACT nasal spray Place 2 sprays into both nostrils daily.     HYDROcodone-acetaminophen (NORCO/VICODIN) 5-325 MG tablet 1-2 tabs bid prn for severe headache that does not significantly improve with your migraine medication 30 tablet 0   Multiple Vitamin (MULTIVITAMIN WITH MINERALS) TABS Take 1 tablet by mouth daily.     Testosterone Cypionate 200 MG/ML SOLN 1 ml IM q14d 1.96 mL 1   valACYclovir (VALTREX) 1000 MG tablet 2 tabs po q12h x 2 doses for acute outbreak of sores on lips (Patient not taking: Reported on 03/06/2022) 20 tablet 2   Facility-Administered Medications Prior to Visit  Medication Dose Route Frequency Provider Last Rate Last Admin   testosterone cypionate (DEPOTESTOSTERONE CYPIONATE) injection 200 mg  200 mg  Intramuscular Q14 Days Jeoffrey Massed, MD   200 mg at 04/29/22 1345    No Known Allergies  ROS As per HPI  PE:    03/06/2022    1:51 PM 11/27/2021   11:24 AM 09/18/2021    8:31 AM  Vitals with BMI  Height 6\' 2"  6\' 2"  6\' 2"   Weight 218 lbs 218 lbs 13 oz 222 lbs  BMI 27.98 28.08 28.49  Systolic 111 122  Diastolic 75 83 78  Pulse 79 76 72     Physical Exam  ***  LABS:  Last CBC Lab Results  Component Value Date   WBC 11.2 (H) 03/06/2022   HGB 14.9 03/06/2022   HCT 43.8 03/06/2022   MCV 92.0 03/06/2022   MCH 31.3 03/06/2022   RDW 13.3 03/06/2022   PLT 316 03/06/2022   Last metabolic panel Lab Results  Component Value Date   GLUCOSE 113 (H) 11/27/2021   NA 138 11/27/2021   K 4.2 11/27/2021   CL 99 11/27/2021   CO2 30 11/27/2021   BUN 11 11/27/2021   CREATININE 0.97 11/27/2021   CALCIUM 9.5 11/27/2021   PROT 7.2 11/27/2021   ALBUMIN 4.4 11/27/2021   BILITOT 0.5 11/27/2021   ALKPHOS 88 11/27/2021   AST 17 11/27/2021   ALT 24 11/27/2021   IMPRESSION AND  PLAN:  No problem-specific Assessment & Plan notes found for this encounter.   An After Visit Summary was printed and given to the patient.  FOLLOW UP: No follow-ups on file.  Signed:  Crissie Sickles, MD           08/17/2022

## 2022-08-19 ENCOUNTER — Encounter: Payer: Self-pay | Admitting: Family Medicine

## 2022-09-14 IMAGING — MR MR CERVICAL SPINE WO/W CM
5 of 9 series · 25 of 48 positions shown · IV contrast (20 ml multihance)
Comparison: Head MRI 11/05/2021.

CLINICAL DATA: Demyelinating disease. Abnormal brain MRI. Right
facial numbness and numbness in the fingers.

EXAM:
MRI CERVICAL AND THORACIC SPINE WITHOUT AND WITH CONTRAST
TECHNIQUE: Multiplanar and multiecho pulse sequences of the cervical spine, to
include the craniocervical junction and cervicothoracic junction,
and the thoracic spine, were obtained without and with intravenous
contrast.
CONTRAST:  20mL MULTIHANCE GADOBENATE DIMEGLUMINE 529 MG/ML IV SOLN

[Series 2: T2 · sagittal · 3.0mm · 0.41mm/px · 4 of 17 slices shown (1 of 3)]
[im 1/17]
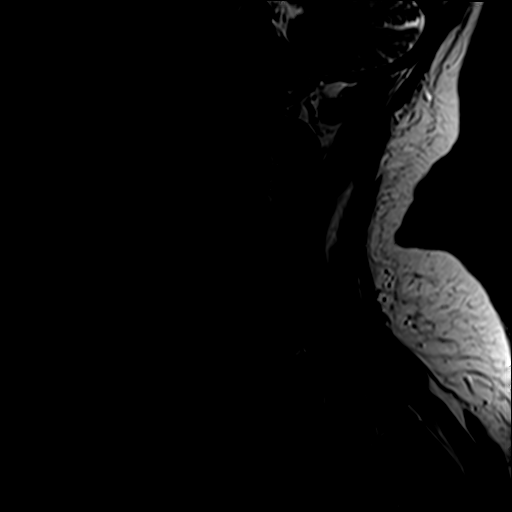
[im 6/17]
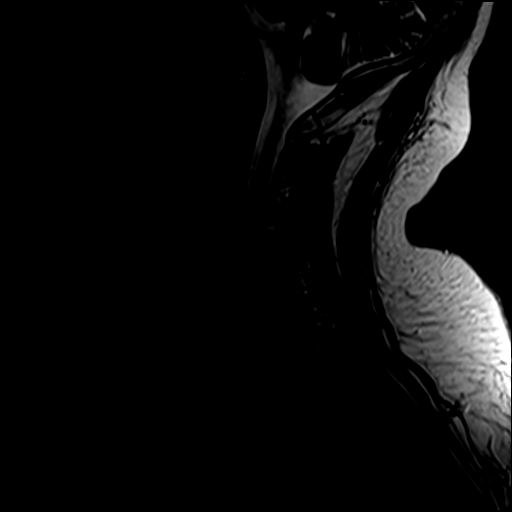
[im 11/17]
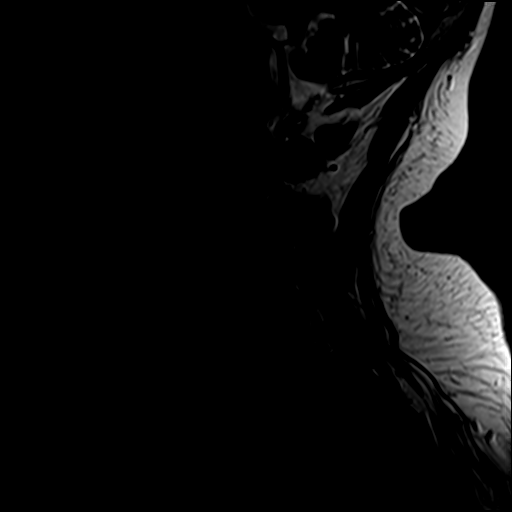
[im 17/17]
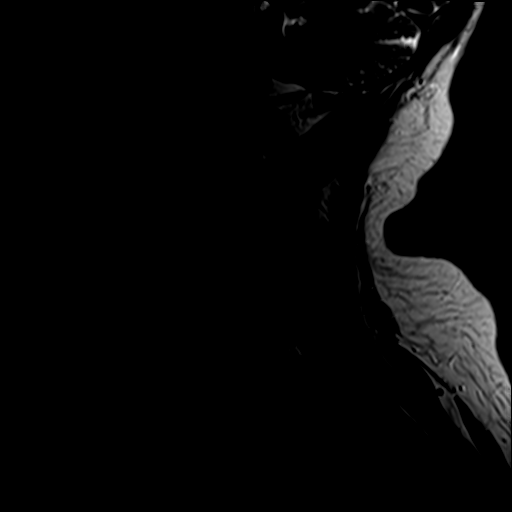

[Series 4: T1 · sagittal · 3.0mm · 0.82mm/px · 4 of 17 slices shown (1 of 2)]
[im 1/17]
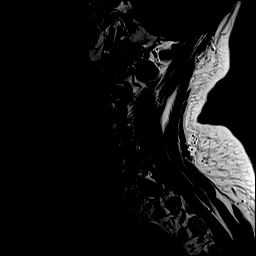
[im 6/17]
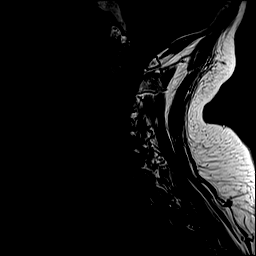
[im 11/17]
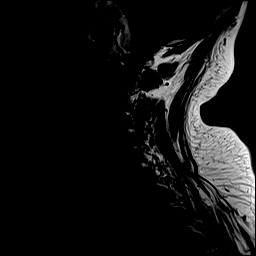
[im 17/17]
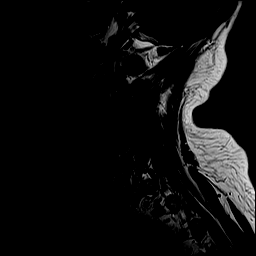

[Series 5: T2 · axial · 3.0mm · 0.70mm/px · z∈[-82,+19]mm · 7 of 29 slices shown (2 of 3)]
[im 1/29]
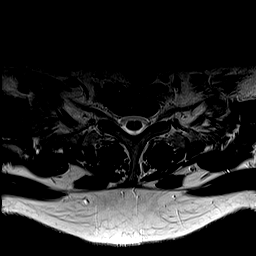
[im 5/29]
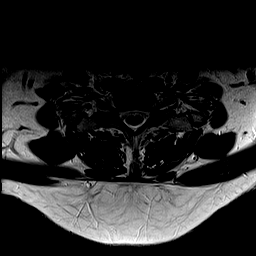
[im 10/29]
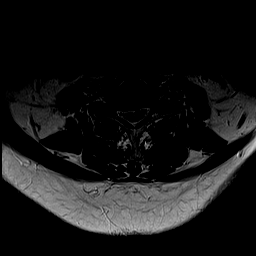
[im 15/29]
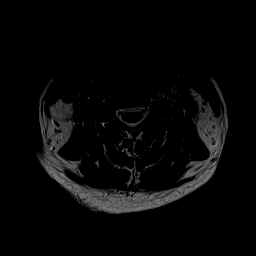
[im 19/29]
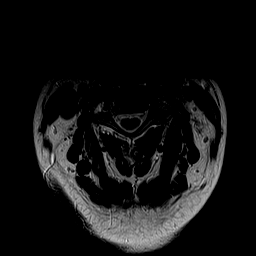
[im 24/29]
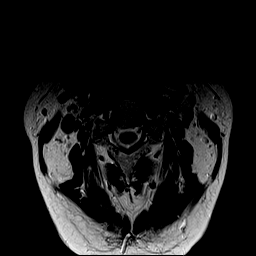
[im 29/29]
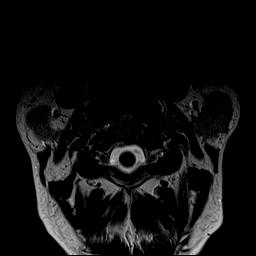

[Series 7: T1 · axial · 3.0mm · 0.35mm/px · z∈[-82,+1]mm · 6 of 29 slices shown (2 of 2)]
[im 1/29]
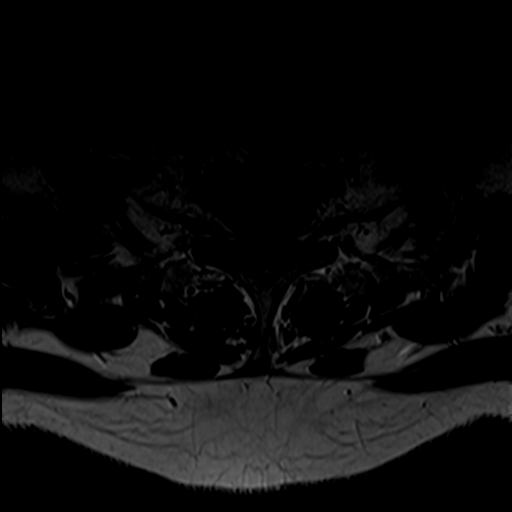
[im 5/29]
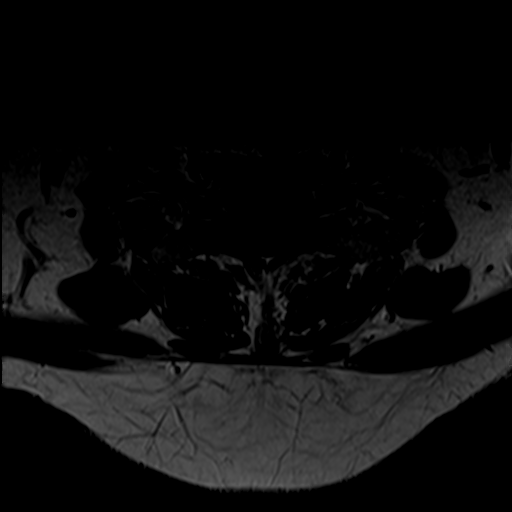
[im 10/29]
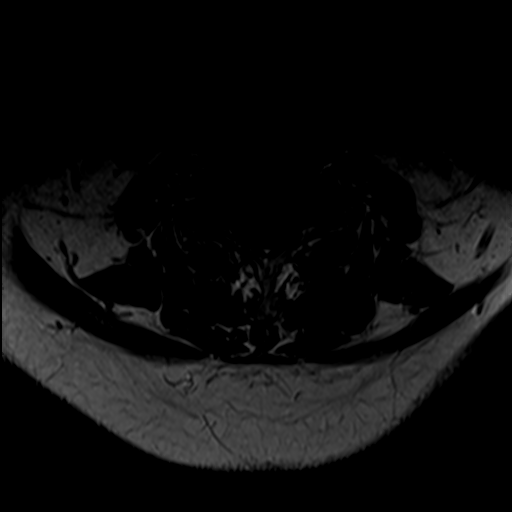
[im 15/29]
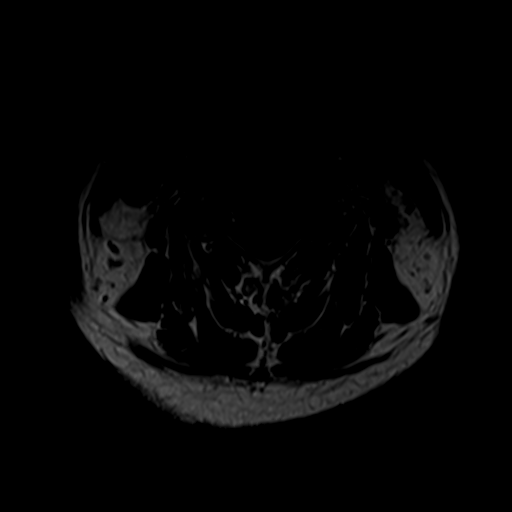
[im 19/29]
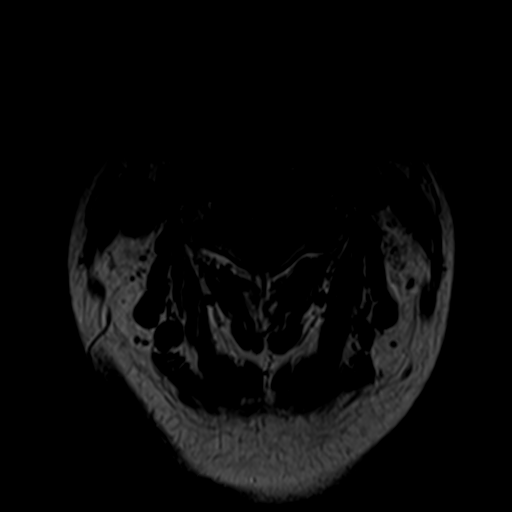
[im 24/29]
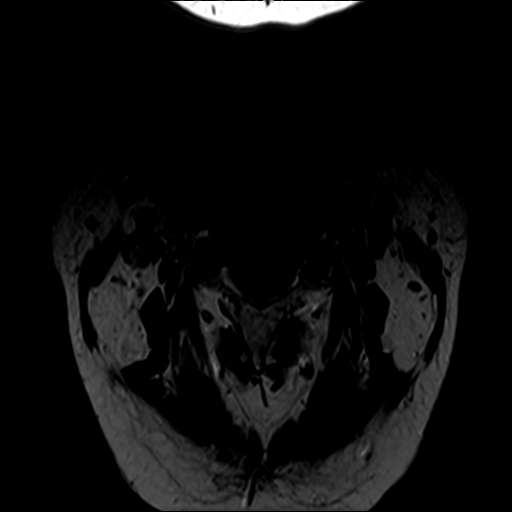

[Series 8: T2 · sagittal · 3.0mm · 0.41mm/px · 4 of 17 slices shown (3 of 3)]
[im 1/17]
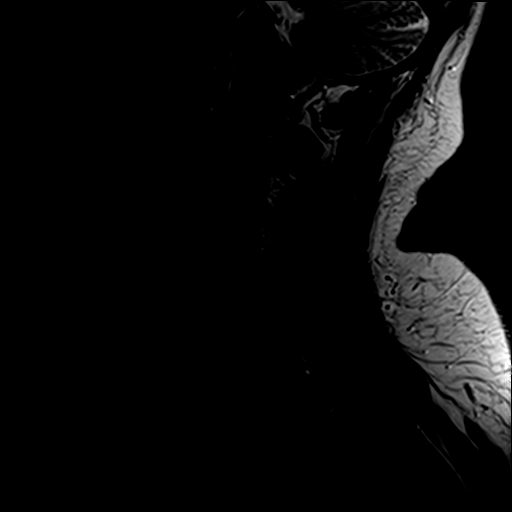
[im 6/17]
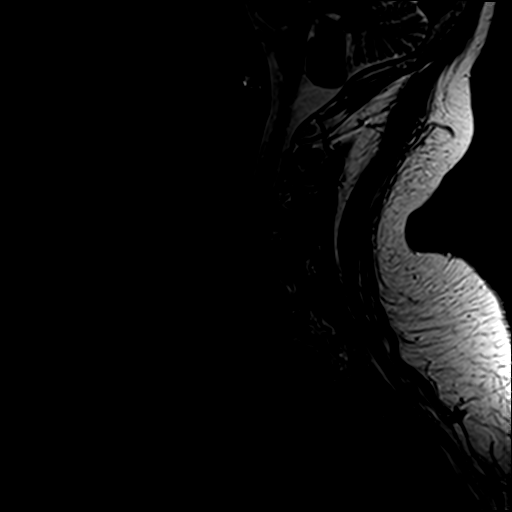
[im 11/17]
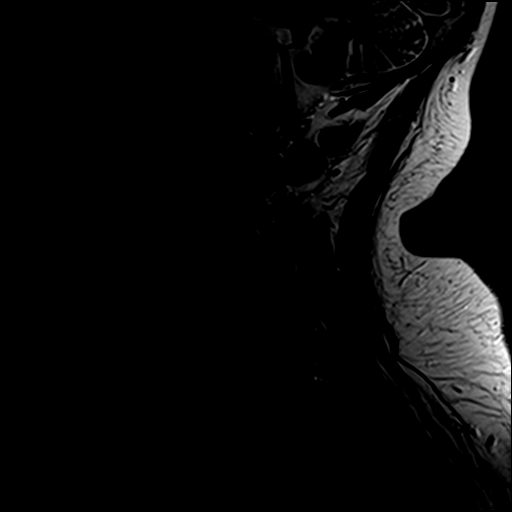
[im 17/17]
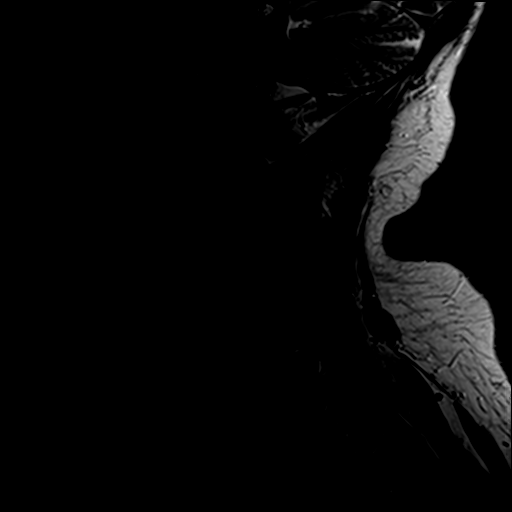

[25 of 48 positions shown; findings below may reference images not displayed]

FINDINGS: MRI CERVICAL SPINE FINDINGS

Alignment: Normal.

Vertebrae: No fracture, suspicious marrow lesion, or significant
marrow edema.

Cord: 5 mm T2 hyperintense lesion in the spinal cord at C5 involving
the dorsal columns. No abnormal cord enhancement. Normal cord size.

Posterior Fossa, vertebral arteries, paraspinal tissues: T2
hyperintense lesions in the right middle cerebellar peduncle and
left cerebellar hemisphere as shown on the prior head MRI. Preserved
vertebral artery flow voids.

Disc levels:

C2-3: Negative.

C3-4: Mild disc bulging and uncovertebral spurring result in
borderline right and mild left neural foraminal stenosis without
spinal stenosis.

C4-5: Negative.

C5-6: Mild disc bulging and asymmetric right uncovertebral spurring
result in moderate right neural foraminal stenosis without spinal
stenosis.

C6-7: Mild disc space narrowing. Disc bulging, a right paracentral
disc protrusion, and uncovertebral spurring result in mild spinal
stenosis and severe right and moderate left neural foraminal
stenosis.

C7-T1: Mild facet arthrosis without stenosis.

MRI THORACIC SPINE FINDINGS

Alignment:  Normal.

Vertebrae: No fracture, suspicious marrow lesion, or significant
marrow edema.

Cord: Subcentimeter T2 hyperintense foci in the left aspect of the
spinal cord at T6-7 and T8-9. Possible additional subcentimeter T2
hyperintensities in the cord at T4-5 and T9-10. No abnormal
enhancement.

Paraspinal and other soft tissues: Unremarkable.

Disc levels:

Small central disc protrusions at T7-8, T8-9, and T9-10 with mild
cord flattening at T7-8 but no significant generalized spinal
stenosis.
IMPRESSION: 1. Nonenhancing T2 hyperintense foci in the thoracic greater than
cervical spinal cord which are nonspecific but suspicious for
demyelinating disease given the brain findings. Alternative
considerations include subacute combined degeneration in the
cervical spine and spondylotic signal abnormality in the thoracic
spine.
2. Multilevel cervical disc degeneration, most notable at C6-7 where
there is mild spinal stenosis and severe right and moderate left
neural foraminal stenosis.
3. Small thoracic disc protrusions without significant stenosis.

## 2022-09-14 IMAGING — MR MR THORACIC SPINE WO/W CM
4 of 9 series · 20 of 48 positions shown · IV contrast (multihance)
Comparison: Head MRI 11/05/2021.

CLINICAL DATA: Demyelinating disease. Abnormal brain MRI. Right
facial numbness and numbness in the fingers.

EXAM:
MRI CERVICAL AND THORACIC SPINE WITHOUT AND WITH CONTRAST
TECHNIQUE: Multiplanar and multiecho pulse sequences of the cervical spine, to
include the craniocervical junction and cervicothoracic junction,
and the thoracic spine, were obtained without and with intravenous
contrast.
CONTRAST:  20mL MULTIHANCE GADOBENATE DIMEGLUMINE 529 MG/ML IV SOLN

[Series 1: T1 · sagittal · 3.0mm · 1.41mm/px · 1 of 7 slices shown]
[im 1/7]
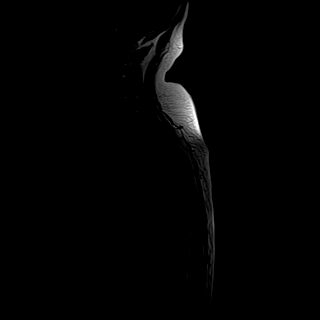

[Series 5: T2 · axial · 4.0mm · 0.39mm/px · z∈[-332,-75]mm · 9 of 39 slices shown (1 of 2)]
[im 1/39]
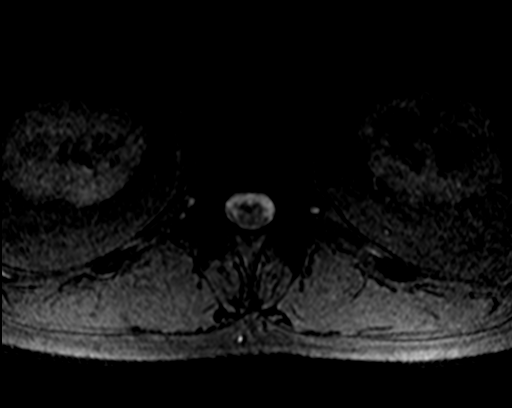
[im 5/39]
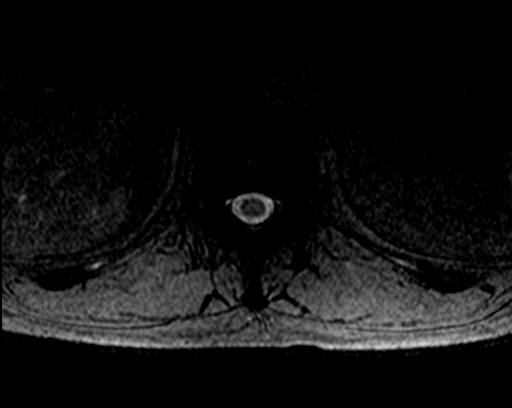
[im 10/39]
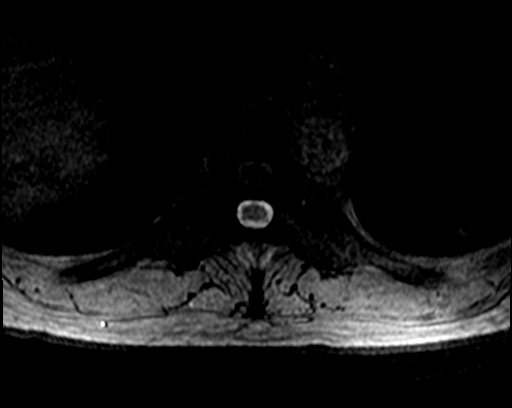
[im 15/39]
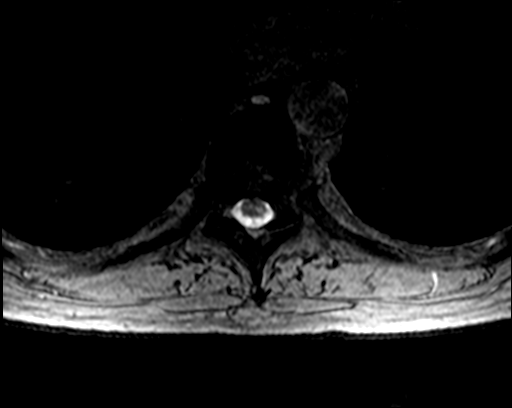
[im 20/39]
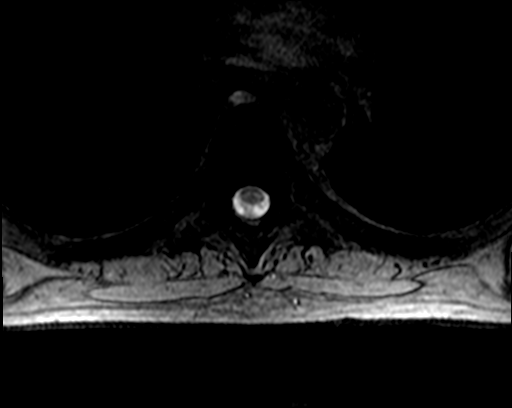
[im 24/39]
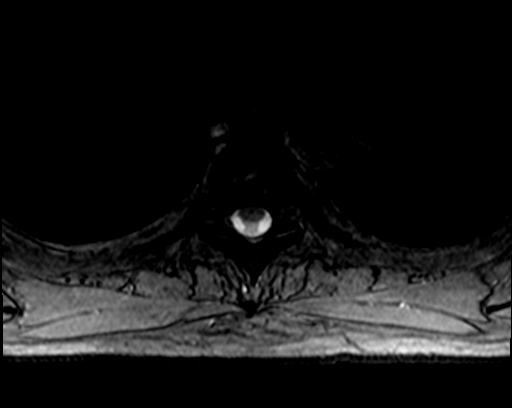
[im 29/39]
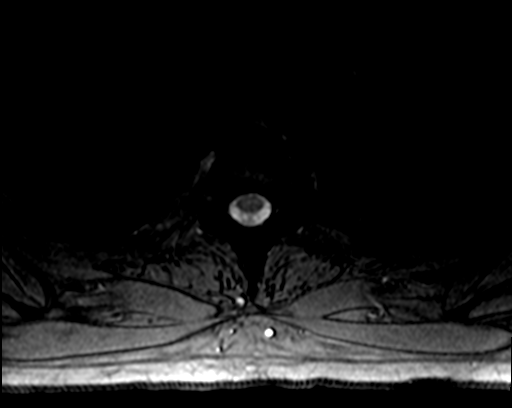
[im 34/39]
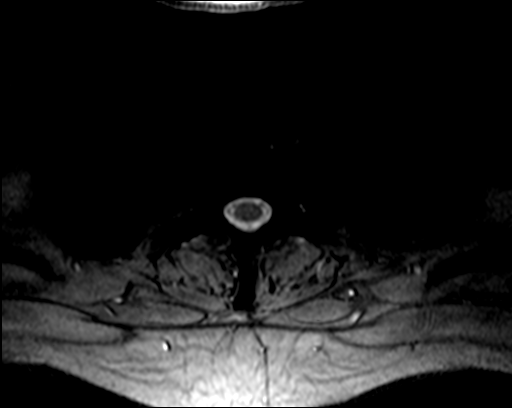
[im 39/39]
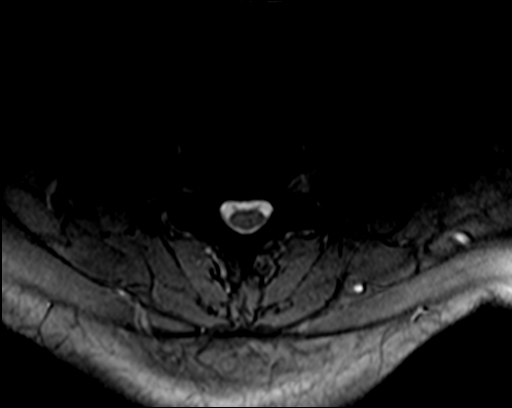

[Series 6: T2 · axial · 4.0mm · 0.39mm/px · z∈[-332,-99]mm · 7 of 39 slices shown (2 of 2)]
[im 1/39]
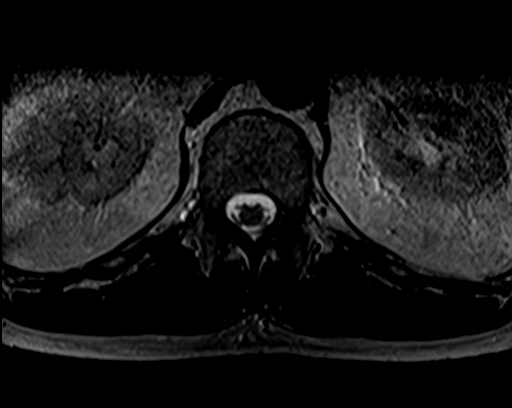
[im 5/39]
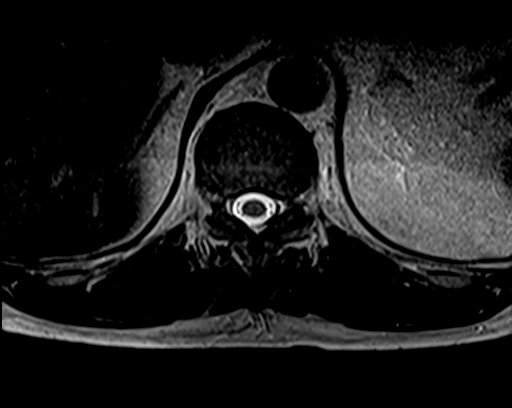
[im 10/39]
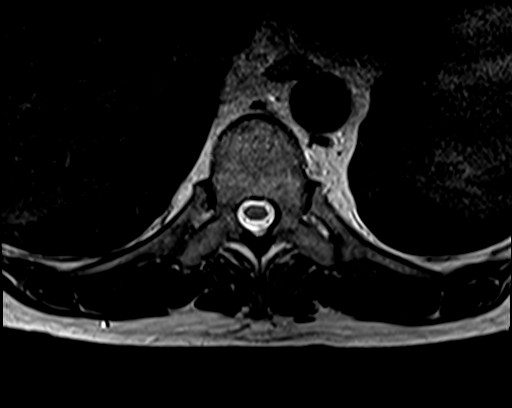
[im 15/39]
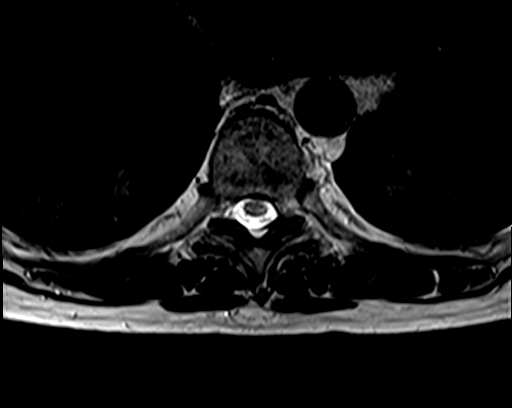
[im 20/39]
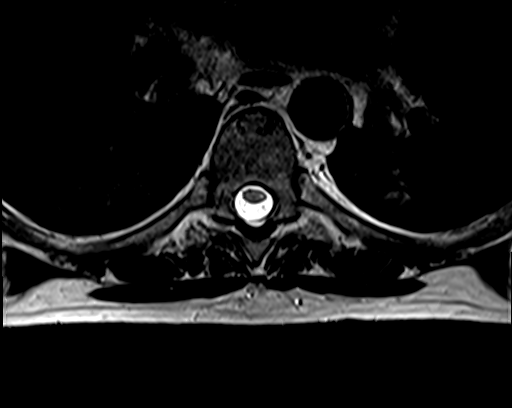
[im 24/39]
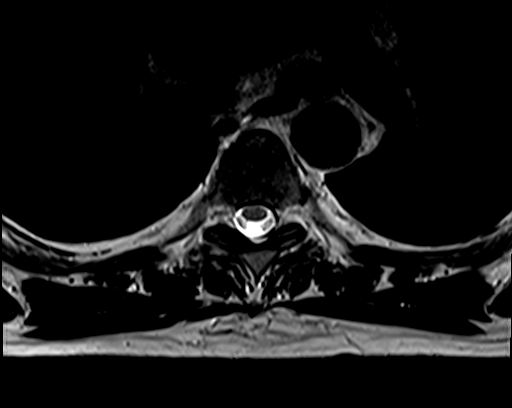
[im 34/39]
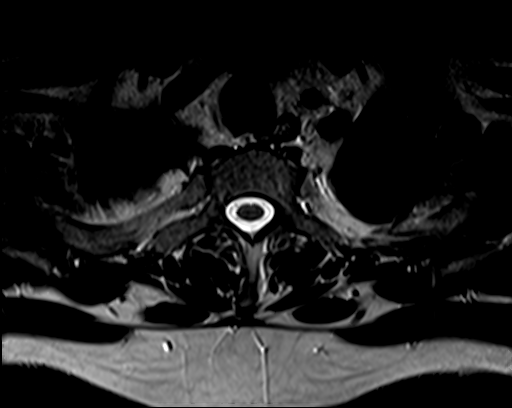

[Series 8: T2 post-contrast · sagittal · 4.0mm · 0.48mm/px · 3 of 14 slices shown]
[im 1/14]
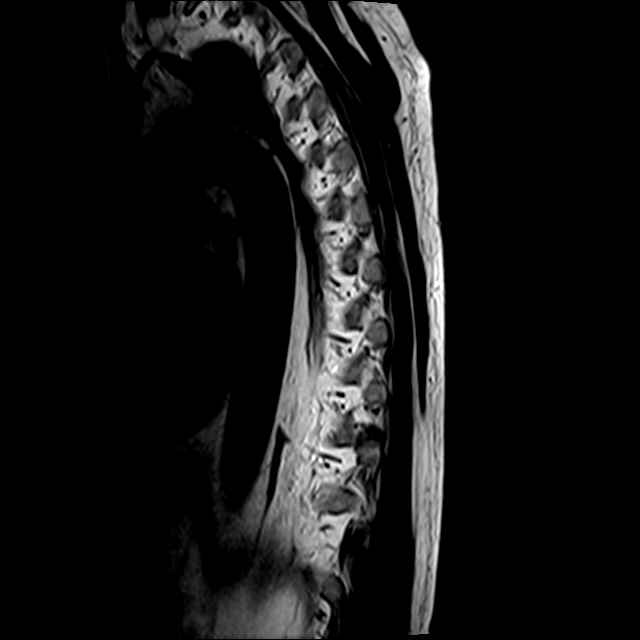
[im 7/14]
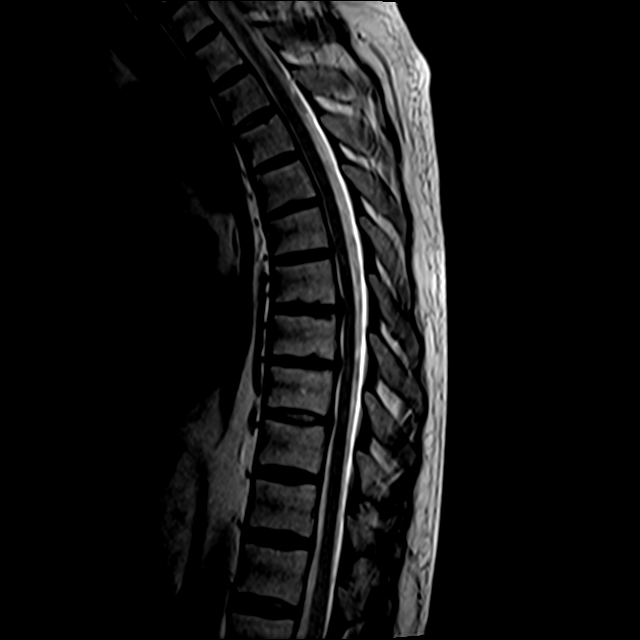
[im 14/14]
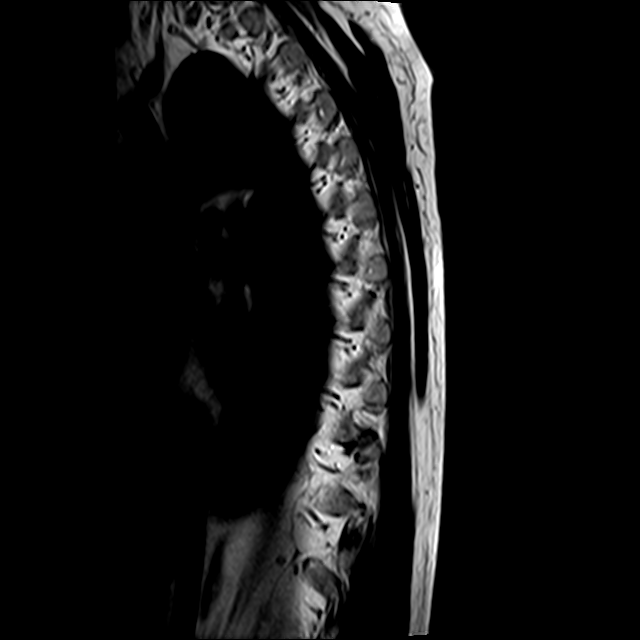

[20 of 48 positions shown; findings below may reference images not displayed]

FINDINGS: MRI CERVICAL SPINE FINDINGS

Alignment: Normal.

Vertebrae: No fracture, suspicious marrow lesion, or significant
marrow edema.

Cord: 5 mm T2 hyperintense lesion in the spinal cord at C5 involving
the dorsal columns. No abnormal cord enhancement. Normal cord size.

Posterior Fossa, vertebral arteries, paraspinal tissues: T2
hyperintense lesions in the right middle cerebellar peduncle and
left cerebellar hemisphere as shown on the prior head MRI. Preserved
vertebral artery flow voids.

Disc levels:

C2-3: Negative.

C3-4: Mild disc bulging and uncovertebral spurring result in
borderline right and mild left neural foraminal stenosis without
spinal stenosis.

C4-5: Negative.

C5-6: Mild disc bulging and asymmetric right uncovertebral spurring
result in moderate right neural foraminal stenosis without spinal
stenosis.

C6-7: Mild disc space narrowing. Disc bulging, a right paracentral
disc protrusion, and uncovertebral spurring result in mild spinal
stenosis and severe right and moderate left neural foraminal
stenosis.

C7-T1: Mild facet arthrosis without stenosis.

MRI THORACIC SPINE FINDINGS

Alignment:  Normal.

Vertebrae: No fracture, suspicious marrow lesion, or significant
marrow edema.

Cord: Subcentimeter T2 hyperintense foci in the left aspect of the
spinal cord at T6-7 and T8-9. Possible additional subcentimeter T2
hyperintensities in the cord at T4-5 and T9-10. No abnormal
enhancement.

Paraspinal and other soft tissues: Unremarkable.

Disc levels:

Small central disc protrusions at T7-8, T8-9, and T9-10 with mild
cord flattening at T7-8 but no significant generalized spinal
stenosis.
IMPRESSION: 1. Nonenhancing T2 hyperintense foci in the thoracic greater than
cervical spinal cord which are nonspecific but suspicious for
demyelinating disease given the brain findings. Alternative
considerations include subacute combined degeneration in the
cervical spine and spondylotic signal abnormality in the thoracic
spine.
2. Multilevel cervical disc degeneration, most notable at C6-7 where
there is mild spinal stenosis and severe right and moderate left
neural foraminal stenosis.
3. Small thoracic disc protrusions without significant stenosis.

## 2022-10-16 DIAGNOSIS — G35 Multiple sclerosis: Secondary | ICD-10-CM | POA: Diagnosis not present

## 2022-11-20 DIAGNOSIS — G35 Multiple sclerosis: Secondary | ICD-10-CM | POA: Diagnosis not present

## 2022-11-20 DIAGNOSIS — U071 COVID-19: Secondary | ICD-10-CM | POA: Diagnosis not present

## 2022-11-20 DIAGNOSIS — J060 Acute laryngopharyngitis: Secondary | ICD-10-CM | POA: Diagnosis not present

## 2022-12-31 DIAGNOSIS — G35 Multiple sclerosis: Secondary | ICD-10-CM | POA: Diagnosis not present

## 2022-12-31 DIAGNOSIS — Z79620 Long term (current) use of immunosuppressive biologic: Secondary | ICD-10-CM | POA: Diagnosis not present

## 2023-04-26 DIAGNOSIS — J019 Acute sinusitis, unspecified: Secondary | ICD-10-CM | POA: Diagnosis not present

## 2023-05-10 ENCOUNTER — Ambulatory Visit (INDEPENDENT_AMBULATORY_CARE_PROVIDER_SITE_OTHER): Payer: BC Managed Care – PPO | Admitting: Family Medicine

## 2023-05-10 ENCOUNTER — Encounter: Payer: Self-pay | Admitting: Family Medicine

## 2023-05-10 VITALS — BP 126/84 | HR 69 | Temp 98.1°F | Wt 209.4 lb

## 2023-05-10 DIAGNOSIS — Z23 Encounter for immunization: Secondary | ICD-10-CM

## 2023-05-10 DIAGNOSIS — J40 Bronchitis, not specified as acute or chronic: Secondary | ICD-10-CM | POA: Diagnosis not present

## 2023-05-10 DIAGNOSIS — R0981 Nasal congestion: Secondary | ICD-10-CM | POA: Diagnosis not present

## 2023-05-10 LAB — POC COVID19 BINAXNOW: SARS Coronavirus 2 Ag: NEGATIVE

## 2023-05-10 MED ORDER — DOXYCYCLINE HYCLATE 100 MG PO TABS
100.0000 mg | ORAL_TABLET | Freq: Two times a day (BID) | ORAL | 0 refills | Status: DC
Start: 2023-05-10 — End: 2023-05-28

## 2023-05-10 MED ORDER — METHYLPREDNISOLONE ACETATE 80 MG/ML IJ SUSP
80.0000 mg | Freq: Once | INTRAMUSCULAR | Status: AC
Start: 2023-05-10 — End: 2023-05-10
  Administered 2023-05-10: 80 mg via INTRAMUSCULAR

## 2023-05-10 NOTE — Patient Instructions (Addendum)
Return in about 2 weeks (around 05/24/2023), or if symptoms worsen or fail to improve.   Rest, hydrate.  Over the counter: flonase, mucinex (DM if cough), zyrtec Doxycyline every 12 hours 2 weeks prescribed, take until completed.       Great to see you today.   If labs were collected, we will inform you of lab results once received either by echart message or telephone call.   - echart message- for normal results that have been seen by the patient already.   - telephone call: abnormal results or if patient has not viewed results in their echart.

## 2023-05-10 NOTE — Progress Notes (Signed)
[facility] , 04/22/1976, 47 y.o., male MRN: [id number] Patient Care Team    Relationship Specialty Notifications Start End  McGowen, Maryjean Morn, MD PCP - General Family Medicine  01/01/14   Van Clines, MD Consulting Physician Neurology  08/16/14   Michele Mcalpine, MD Consulting Physician Pulmonary Disease  09/29/16   Sim Boast Consulting Physician Pulmonary Disease  09/29/16   Drema Dallas, DO Consulting Physician Neurology  03/06/22   Aretha Parrot, MD Consulting Physician Neurology  03/06/22     Chief Complaint  Patient presents with   Cough    States feels like cough has been for a month; did telehealth thru insurance and was on Abx for 2 weeks; congestion started about a week ago     Subjective: Dylan Pope is a 47 y.o. Pt presents for an OV with complaints of cough of 4 weeks duration.  Associated symptoms include mild mucous production.He was seen by another provider 4 weeks ago and given Amoxicillin x7 days and tessalon perles. Pt has use to take  flonase - spring only, sometimes claritin to ease their symptoms.      03/06/2022    1:51 PM 05/03/2020   11:26 AM 02/04/2018    1:17 PM  Depression screen PHQ 2/9  Decreased Interest 0 0 0  Down, Depressed, Hopeless 0 0 0  PHQ - 2 Score 0 0 0    No Known Allergies Social History   Social History Narrative   Married, no children.   Manager at [employer].   Regular exercise, yes.   5 pack-yr smoking hx; quit about 2005.  Alc: 2 beers a day, no wine or hard liquor.           Past Medical History:  Diagnosis Date   Cluster headaches 02/27/2010   Long history of HA's.  CT head neg 02/2013.  CT sinuses neg 2014.   Environmental allergies    LOW BACK PAIN 08/15/2009   Migraine syndrome    Multiple sclerosis (HCC)    relapsing-remitting->initial dx Dr. Everlena Cooper 10/2021.  2nd opinion Dr. Macarthur Critchley 11/2021.   OSA on CPAP    Dr. Kriste Basque   Restless legs syndrome    Sleep study 2017   TOBACCO USE, QUIT  08/15/2009   Past Surgical History:  Procedure Laterality Date   NASAL SINUS SURGERY  2014   Family History  Problem Relation Age of Onset   Hypertension Other    Hyperlipidemia Other    Hypertension Father    Hyperlipidemia Father    Allergies as of 05/10/2023   No Known Allergies      Medication List        Accurate as of May 10, 2023  2:58 PM. If you have any questions, ask your nurse or doctor.          acetaminophen 500 MG tablet Commonly known as: TYLENOL Take 500 mg by mouth every 6 (six) hours as needed.   baclofen 10 MG tablet Commonly known as: LIORESAL Take 10 mg by mouth daily as needed.   Dimethyl Fumarate Starter Pack 120 & 240 MG Misc Take by mouth.   doxycycline 100 MG tablet Commonly known as: VIBRA-TABS Take 1 tablet (100 mg total) by mouth 2 (two) times daily. Started by: Felix Pacini, DO   fluticasone 50 MCG/ACT nasal spray Commonly known as: FLONASE Place 2 sprays into both nostrils daily.   HYDROcodone-acetaminophen 5-325 MG tablet Commonly known as:  NORCO/VICODIN 1-2 tabs bid prn for severe headache that does not significantly improve with your migraine medication   multivitamin with minerals Tabs tablet Take 1 tablet by mouth daily.   Testosterone Cypionate 200 MG/ML Soln 1 ml IM q14d   valACYclovir 1000 MG tablet Commonly known as: VALTREX 2 tabs po q12h x 2 doses for acute outbreak of sores on lips   VITAMIN B12 PO Take 5,000 Units by mouth daily.   VITAMIN D-3 PO Take 1,000 Units by mouth daily.        All past medical history, surgical history, allergies, family history, immunizations andmedications were updated in the EMR today and reviewed under the history and medication portions of their EMR.     ROS Negative, with the exception of above mentioned in HPI   Objective:  BP 126/84   Pulse 69   Temp 98.1 F (36.7 C)   Wt 209 lb 6.4 oz (95 kg)   SpO2 98%   BMI 26.89 kg/m  Body mass index is 26.89  kg/m. Physical Exam Vitals and nursing note reviewed.  Constitutional:      General: He is not in acute distress.    Appearance: Normal appearance. He is not ill-appearing, toxic-appearing or diaphoretic.  HENT:     Head: Normocephalic and atraumatic.     Right Ear: Tympanic membrane and ear canal normal.     Left Ear: Tympanic membrane and ear canal normal.     Nose: Rhinorrhea present. No congestion.     Mouth/Throat:     Mouth: Mucous membranes are moist.     Pharynx: No oropharyngeal exudate or posterior oropharyngeal erythema.     Comments: PND present Eyes:     General: No scleral icterus.       Right eye: No discharge.        Left eye: No discharge.     Extraocular Movements: Extraocular movements intact.     Pupils: Pupils are equal, round, and reactive to light.  Cardiovascular:     Rate and Rhythm: Normal rate and regular rhythm.  Pulmonary:     Effort: Pulmonary effort is normal. No respiratory distress.     Breath sounds: Rhonchi (very mild) present. No wheezing or rales.  Skin:    General: Skin is warm and dry.     Coloration: Skin is not jaundiced or pale.     Findings: No rash.  Neurological:     Mental Status: He is alert and oriented to person, place, and time. Mental status is at baseline.  Psychiatric:        Mood and Affect: Mood normal.        Behavior: Behavior normal.        Thought Content: Thought content normal.        Judgment: Judgment normal.     No results found. No results found. Results for orders placed or performed in visit on 05/10/23 (from the past 24 hour(s))  POC COVID-19 BinaxNow     Status: None   Collection Time: 05/10/23  2:24 PM  Result Value Ref Range   SARS Coronavirus 2 Ag Negative Negative    Assessment/Plan: Dylan Pope is a 47 y.o. male present for OV for  Congestion of nasal sinus - POC COVID-19 BinaxNow> negative Bronchitis Rest, hydrate.  flonase, mucinex (DM if cough), zyrtec IM depo medrol 80 provided  today Doxy bid 2 qeeks prescribed, take until completed.  If cough present it can last up to 6-8 weeks.  F/U  2 weeks if not improved.   Reviewed expectations re: course of current medical issues. Discussed self-management of symptoms. Outlined signs and symptoms indicating need for more acute intervention. Patient verbalized understanding and all questions were answered. Patient received an After-Visit Summary.    Orders Placed This Encounter  Procedures   POC COVID-19 BinaxNow   Meds ordered this encounter  Medications   doxycycline (VIBRA-TABS) 100 MG tablet    Sig: Take 1 tablet (100 mg total) by mouth 2 (two) times daily.    Dispense:  20 tablet    Refill:  0   methylPREDNISolone acetate (DEPO-MEDROL) injection 80 mg   Referral Orders  No referral(s) requested today     Note is dictated utilizing voice recognition software. Although note has been proof read prior to signing, occasional typographical errors still can be missed. If any questions arise, please do not hesitate to call for verification.   electronically signed by:  Felix Pacini, DO  [facility] [place]

## 2023-05-25 NOTE — Patient Instructions (Signed)

## 2023-05-28 ENCOUNTER — Encounter: Payer: Self-pay | Admitting: Family Medicine

## 2023-05-28 ENCOUNTER — Ambulatory Visit (INDEPENDENT_AMBULATORY_CARE_PROVIDER_SITE_OTHER): Payer: BC Managed Care – PPO | Admitting: Family Medicine

## 2023-05-28 VITALS — BP 136/89 | HR 78 | Temp 98.8°F | Ht 74.0 in | Wt 207.6 lb

## 2023-05-28 DIAGNOSIS — Z1211 Encounter for screening for malignant neoplasm of colon: Secondary | ICD-10-CM

## 2023-05-28 DIAGNOSIS — G43909 Migraine, unspecified, not intractable, without status migrainosus: Secondary | ICD-10-CM

## 2023-05-28 DIAGNOSIS — R7989 Other specified abnormal findings of blood chemistry: Secondary | ICD-10-CM | POA: Diagnosis not present

## 2023-05-28 DIAGNOSIS — E559 Vitamin D deficiency, unspecified: Secondary | ICD-10-CM | POA: Diagnosis not present

## 2023-05-28 DIAGNOSIS — E538 Deficiency of other specified B group vitamins: Secondary | ICD-10-CM

## 2023-05-28 DIAGNOSIS — Z0000 Encounter for general adult medical examination without abnormal findings: Secondary | ICD-10-CM

## 2023-05-28 MED ORDER — HYDROCODONE-ACETAMINOPHEN 5-325 MG PO TABS: ORAL_TABLET | ORAL | 0 refills | Status: DC

## 2023-05-28 NOTE — Progress Notes (Signed)
Office Note 05/28/2023  CC:  Chief Complaint  Patient presents with   Annual Exam    Fasting CPE.    HPI:  Patient is a 47 y.o. male who is here for annual health maintenance exam and f/u low testosterone. Inj x 2 > 1 yr ago, cost was an issue so he stopped.  Still dec libido.  Rare need for vicodin for 2nd line migraine med, has run out, though.  Feeling well.   PMP AWARE reviewed today: most recent rx for testosterone was filled 03/17/22, # 2ml, rx by me. Most recent vicodin rx filled 03/06/22, #30, rx by me. No red flags.  Past Medical History:  Diagnosis Date   Cluster headaches 02/27/2010   Long history of HA's.  CT head neg 02/2013.  CT sinuses neg 2014.   Environmental allergies    LOW BACK PAIN 08/15/2009   Migraine syndrome    Multiple sclerosis (HCC)    relapsing-remitting->initial dx Dr. Everlena Cooper 10/2021.  2nd opinion Dr. Macarthur Critchley 11/2021.   OSA on CPAP    Dr. Kriste Basque   Restless legs syndrome    Sleep study 2017   TOBACCO USE, QUIT 08/15/2009    Past Surgical History:  Procedure Laterality Date   NASAL SINUS SURGERY  2014    Family History  Problem Relation Age of Onset   Hypertension Other    Hyperlipidemia Other    Hypertension Father    Hyperlipidemia Father     Social History   Socioeconomic History   Marital status: Married    Spouse name: Not on file   Number of children: Not on file   Years of education: Not on file   Highest education level: Associate degree: occupational, Scientist, product/process development, or vocational program  Occupational History   Occupation: Personnel officer: [employer]  Tobacco Use   Smoking status: Former    Current packs/day: 0.00    Average packs/day: 0.5 packs/day for 12.0 years (6.0 ttl pk-yrs)    Types: Cigarettes    Start date: 09/25/1997    Quit date: 09/25/2009    Years since quitting: 13.6   Smokeless tobacco: Never  Substance and Sexual Activity   Alcohol use: Yes    Alcohol/week: 0.0 standard drinks of  alcohol    Comment: occasional use   Drug use: No   Sexual activity: Yes  Other Topics Concern   Not on file  Social History Narrative   Married, no children.   Manager at [employer].   Regular exercise, yes.   5 pack-yr smoking hx; quit about 2005.  Alc: 2 beers a day, no wine or hard liquor.           Social Determinants of Health   Financial Resource Strain: Low Risk  (08/16/2022)   Overall Financial Resource Strain (CARDIA)    Difficulty of Paying Living Expenses: Not very hard  Food Insecurity: No Food Insecurity (08/16/2022)   Hunger Vital Sign    Worried About Running Out of Food in the Last Year: Never true    Ran Out of Food in the Last Year: Never true  Transportation Needs: No Transportation Needs (08/16/2022)   PRAPARE - Administrator, Civil Service (Medical): No    Lack of Transportation (Non-Medical): No  Physical Activity: Sufficiently Active (08/16/2022)   Exercise Vital Sign    Days of Exercise per Week: 7 days    Minutes of Exercise per Session: 130 min  Stress: Stress Concern  Present (08/16/2022)   [employer] of Occupational Health - Occupational Stress Questionnaire    Feeling of Stress : To some extent  Social Connections: Socially Integrated (08/16/2022)   Social Connection and Isolation Panel [NHANES]    Frequency of Communication with Friends and Family: Three times a week    Frequency of Social Gatherings with Friends and Family: Once a week    Attends Religious Services: More than 4 times per year    Active Member of [employer] or Organizations: Yes    Attends Engineer, structural: More than 4 times per year    Marital Status: Married  Catering manager Violence: Not on file    Outpatient Medications Prior to Visit  Medication Sig Dispense Refill   acetaminophen (TYLENOL) 500 MG tablet Take 500 mg by mouth every 6 (six) hours as needed.     Cholecalciferol (VITAMIN D-3 PO) Take 1,000 Units by mouth daily.      Cyanocobalamin (VITAMIN B12 PO) Take 5,000 Units by mouth daily.     fluticasone (FLONASE) 50 MCG/ACT nasal spray Place 2 sprays into both nostrils daily.     Multiple Vitamin (MULTIVITAMIN WITH MINERALS) TABS Take 1 tablet by mouth daily.     valACYclovir (VALTREX) 1000 MG tablet 2 tabs po q12h x 2 doses for acute outbreak of sores on lips 20 tablet 2   HYDROcodone-acetaminophen (NORCO/VICODIN) 5-325 MG tablet 1-2 tabs bid prn for severe headache that does not significantly improve with your migraine medication 30 tablet 0   Testosterone Cypionate 200 MG/ML SOLN 1 ml IM q14d 1.96 mL 1   baclofen (LIORESAL) 10 MG tablet Take 10 mg by mouth daily as needed. (Patient not taking: Reported on 05/28/2023)     Dimethyl Fumarate Starter Pack 120 & 240 MG MISC Take by mouth. (Patient not taking: Reported on 05/28/2023)     doxycycline (VIBRA-TABS) 100 MG tablet Take 1 tablet (100 mg total) by mouth 2 (two) times daily. (Patient not taking: Reported on 05/28/2023) 20 tablet 0   Facility-Administered Medications Prior to Visit  Medication Dose Route Frequency Provider Last Rate Last Admin   testosterone cypionate (DEPOTESTOSTERONE CYPIONATE) injection 200 mg  200 mg Intramuscular Q14 Days Christyna Letendre, Maryjean Morn, MD   200 mg at 04/29/22 1345    No Known Allergies  Review of Systems  Constitutional:  Negative for appetite change, chills, fatigue and fever.  HENT:  Negative for congestion, dental problem, ear pain and sore throat.   Eyes:  Negative for discharge, redness and visual disturbance.  Respiratory:  Negative for cough, chest tightness, shortness of breath and wheezing.   Cardiovascular:  Negative for chest pain, palpitations and leg swelling.  Gastrointestinal:  Negative for abdominal pain, blood in stool, diarrhea, nausea and vomiting.  Genitourinary:  Negative for difficulty urinating, dysuria, flank pain, frequency, hematuria and urgency.  Musculoskeletal:  Negative for arthralgias, back pain, joint  swelling, myalgias and neck stiffness.  Skin:  Negative for pallor and rash.  Neurological:  Negative for dizziness, speech difficulty, weakness and headaches.  Hematological:  Negative for adenopathy. Does not bruise/bleed easily.  Psychiatric/Behavioral:  Negative for confusion and sleep disturbance. The patient is not nervous/anxious.     PE;    05/28/2023    1:49 PM 05/10/2023    1:07 PM 05/10/2023    1:04 PM  Vitals with BMI  Height 6\' 2"     Weight 207 lbs 10 oz  209 lbs 6 oz  BMI 26.64    Systolic 136  126 134  Diastolic 89 84 92  Pulse 78  69     Gen: Alert, well appearing.  Patient is oriented to person, place, time, and situation. AFFECT: pleasant, lucid thought and speech. ENT: Ears: EACs clear, normal epithelium.  TMs with good light reflex and landmarks bilaterally.  Eyes: no injection, icteris, swelling, or exudate.  EOMI, PERRLA. Nose: no drainage or turbinate edema/swelling.  No injection or focal lesion.  Mouth: lips without lesion/swelling.  Oral mucosa pink and moist.  Dentition intact and without obvious caries or gingival swelling.  Oropharynx without erythema, exudate, or swelling.  Neck: supple/nontender.  No LAD, mass, or TM.  Carotid pulses 2+ bilaterally, without bruits. CV: RRR, no m/r/g.   LUNGS: CTA bilat, nonlabored resps, good aeration in all lung fields. ABD: soft, NT, ND, BS normal.  No hepatospenomegaly or mass.  No bruits. EXT: no clubbing, cyanosis, or edema.  Musculoskeletal: no joint swelling, erythema, warmth, or tenderness.  ROM of all joints intact. Skin - no sores or suspicious lesions or rashes or color changes  Pertinent labs:  Lab Results  Component Value Date   TSH 1.42 03/06/2022   Lab Results  Component Value Date   WBC 11.2 (H) 03/06/2022   HGB 14.9 03/06/2022   HCT 43.8 03/06/2022   MCV 92.0 03/06/2022   PLT 316 03/06/2022   Lab Results  Component Value Date   CREATININE 0.97 11/27/2021   BUN 11 11/27/2021   NA 138  11/27/2021   K 4.2 11/27/2021   CL 99 11/27/2021   CO2 30 11/27/2021   Lab Results  Component Value Date   ALT 24 11/27/2021   AST 17 11/27/2021   ALKPHOS 88 11/27/2021   BILITOT 0.5 11/27/2021   Lab Results  Component Value Date   CHOL 261 (H) 07/18/2021   Lab Results  Component Value Date   HDL 48 07/18/2021   Lab Results  Component Value Date   LDLCALC 170 (H) 07/18/2021   Lab Results  Component Value Date   TRIG 264 (H) 07/18/2021   Lab Results  Component Value Date   CHOLHDL 5.4 (H) 07/18/2021   Lab Results  Component Value Date   TESTOSTERONE 197.81 (L) 03/13/2022   Lab Results  Component Value Date   VITAMINB12 1,180 (H) 03/06/2022  Last vitamin D Lab Results  Component Value Date   VD25OH 15.43 (L) 11/27/2021    ASSESSMENT AND PLAN:   1) Health maintenance exam: Reviewed age and gender appropriate health maintenance issues (prudent diet, regular exercise, health risks of tobacco and excessive alcohol, use of seatbelts, fire alarms in home, use of sunscreen).  Also reviewed age and gender appropriate health screening as well as vaccine recommendations. Vaccines: Tdap-->deferred.  He needs to check with his neurologist about whether or not vaccines are ok and if they are then what is the timing surrounding his ocrevus infusions? Labs: HP ordered Prostate ca screening: average risk patient= as per latest guidelines, start screening at 45 yrs of age. Colon ca screening:  Due for initial screening.  Options discussed.  Cologuard ordered  2) Secondary hypogonadism. Injections too$. Recheck testost and LH and as long as testost still very low then try androgel.  3) Migraine syndrome: tylenol or NSAID is helpful many times but occ vicodin needed (rare)-->vicodin 5/325, 1-2 bid prn, #30.  4) Vit d def-->cont 1000 U every day. Check level today.  5) Vit B12 def-->normalized on oral supp-->cont 5000 mcg every day. Check b12 level today  An After Visit  Summary was printed and given to the patient.  FOLLOW UP:  Return in about 6 months (around 11/28/2023) for routine chronic illness f/u.  Signed:  Santiago Bumpers, MD           05/28/2023

## 2023-05-29 LAB — CBC WITH DIFFERENTIAL/PLATELET
Absolute Monocytes: 807 cells/uL (ref 200–950)
Basophils Absolute: 65 cells/uL (ref 0–200)
Basophils Relative: 0.6 %
Eosinophils Absolute: 65 cells/uL (ref 15–500)
Eosinophils Relative: 0.6 %
HCT: 44.6 % (ref 38.5–50.0)
Hemoglobin: 15 g/dL (ref 13.2–17.1)
Lymphs Abs: 1722 cells/uL (ref 850–3900)
MCH: 30.7 pg (ref 27.0–33.0)
MCHC: 33.6 g/dL (ref 32.0–36.0)
MCV: 91.4 fL (ref 80.0–100.0)
MPV: 10.2 fL (ref 7.5–12.5)
Monocytes Relative: 7.4 %
Neutro Abs: 8240 cells/uL — ABNORMAL HIGH (ref 1500–7800)
Neutrophils Relative %: 75.6 %
Platelets: 311 10*3/uL (ref 140–400)
RBC: 4.88 10*6/uL (ref 4.20–5.80)
RDW: 13.5 % (ref 11.0–15.0)
Total Lymphocyte: 15.8 %
WBC: 10.9 10*3/uL — ABNORMAL HIGH (ref 3.8–10.8)

## 2023-05-29 LAB — COMPREHENSIVE METABOLIC PANEL
AG Ratio: 1.8 (calc) (ref 1.0–2.5)
ALT: 19 U/L (ref 9–46)
AST: 15 U/L (ref 10–40)
Albumin: 4.8 g/dL (ref 3.6–5.1)
Alkaline phosphatase (APISO): 84 U/L (ref 36–130)
BUN: 10 mg/dL (ref 7–25)
CO2: 28 mmol/L (ref 20–32)
Calcium: 10 mg/dL (ref 8.6–10.3)
Chloride: 101 mmol/L (ref 98–110)
Creat: 1 mg/dL (ref 0.60–1.29)
Globulin: 2.6 g/dL (calc) (ref 1.9–3.7)
Glucose, Bld: 96 mg/dL (ref 65–99)
Potassium: 5 mmol/L (ref 3.5–5.3)
Sodium: 139 mmol/L (ref 135–146)
Total Bilirubin: 0.7 mg/dL (ref 0.2–1.2)
Total Protein: 7.4 g/dL (ref 6.1–8.1)

## 2023-05-29 LAB — VITAMIN B12: Vitamin B-12: 426 pg/mL (ref 200–1100)

## 2023-05-29 LAB — LIPID PANEL
Cholesterol: 255 mg/dL — ABNORMAL HIGH (ref <200)
HDL: 69 mg/dL (ref >=40)
LDL Cholesterol (Calc): 152 mg/dL (calc) — ABNORMAL HIGH
Non-HDL Cholesterol (Calc): 186 mg/dL (calc) — ABNORMAL HIGH (ref <130)
Total CHOL/HDL Ratio: 3.7 (calc) (ref <5.0)
Triglycerides: 196 mg/dL — ABNORMAL HIGH (ref <150)

## 2023-05-29 LAB — TESTOSTERONE: Testosterone: 117 ng/dL — ABNORMAL LOW (ref 250–827)

## 2023-05-29 LAB — VITAMIN D 25 HYDROXY (VIT D DEFICIENCY, FRACTURES): Vit D, 25-Hydroxy: 58 ng/mL (ref 30–100)

## 2023-05-29 LAB — TSH: TSH: 0.98 mIU/L (ref 0.40–4.50)

## 2023-05-29 LAB — LUTEINIZING HORMONE: LH: 2.5 m[IU]/mL (ref 1.5–9.3)

## 2023-06-14 ENCOUNTER — Encounter: Payer: Self-pay | Admitting: Family Medicine

## 2023-06-14 MED ORDER — HYDROCODONE-ACETAMINOPHEN 5-325 MG PO TABS: ORAL_TABLET | ORAL | 0 refills | Status: DC

## 2023-06-14 NOTE — Telephone Encounter (Signed)
Hydrocodone prescription printed

## 2023-06-15 ENCOUNTER — Encounter: Payer: Self-pay | Admitting: Family Medicine

## 2023-06-16 ENCOUNTER — Encounter: Payer: Self-pay | Admitting: Family Medicine

## 2023-06-16 ENCOUNTER — Ambulatory Visit: Payer: BC Managed Care – PPO | Admitting: Family Medicine

## 2023-06-16 VITALS — BP 127/86 | HR 66 | Wt 209.2 lb

## 2023-06-16 DIAGNOSIS — J209 Acute bronchitis, unspecified: Secondary | ICD-10-CM

## 2023-06-16 DIAGNOSIS — E291 Testicular hypofunction: Secondary | ICD-10-CM

## 2023-06-16 MED ORDER — PREDNISONE 20 MG PO TABS: ORAL_TABLET | ORAL | 0 refills | Status: DC

## 2023-06-16 MED ORDER — TESTOSTERONE 20.25 MG/ACT (1.62%) TD GEL: TRANSDERMAL | 5 refills | Status: DC

## 2023-06-16 NOTE — Progress Notes (Signed)
OFFICE VISIT  06/16/2023  CC:  Chief Complaint  Patient presents with   Cough    & chest congestion. Been going on a couple months altogether. Went on 2 different antibiotics, both have seemed to help but cough/congestion keeps coming back. Pt states he is feeling some sinus pain as well as coughing up phlegm.     Patient is a 47 y.o. male who presents for ongoing cough.  INTERIM HX: About 2 months ago he developed a cough that was mildly productive.  No wheezing or shortness of breath.  After a couple of weeks he saw a provider via telemedicine and was prescribed amoxicillin and Tessalon Perles. Things did not clear up so he was seen again a few weeks later and was given a Depo-Medrol injection and prescribed doxycycline for 2 weeks. Things cleared up quite a bit and when I saw him several weeks ago for his complete physical he was almost completely over it. The cough seems to be persisting.  Has some mild nasal congestion.  No fever.  Does not feel acutely ill. He has tried some Mucinex.    Past Medical History:  Diagnosis Date   Cluster headaches 02/27/2010   Long history of HA's.  CT head neg 02/2013.  CT sinuses neg 2014.   Environmental allergies    LOW BACK PAIN 08/15/2009   Migraine syndrome    Multiple sclerosis (HCC)    relapsing-remitting->initial dx Dr. Everlena Cooper 10/2021.  2nd opinion Dr. Macarthur Critchley 11/2021.   OSA on CPAP    Dr. Kriste Basque   Restless legs syndrome    Sleep study 2017   Secondary male hypogonadism    TOBACCO USE, QUIT 08/15/2009    Past Surgical History:  Procedure Laterality Date   NASAL SINUS SURGERY  2014    Outpatient Medications Prior to Visit  Medication Sig Dispense Refill   acetaminophen (TYLENOL) 500 MG tablet Take 500 mg by mouth every 6 (six) hours as needed.     Cholecalciferol (VITAMIN D-3 PO) Take 1,000 Units by mouth daily.     Cyanocobalamin (VITAMIN B12 PO) Take 5,000 Units by mouth daily.     fluticasone (FLONASE) 50 MCG/ACT nasal spray  Place 2 sprays into both nostrils daily.     HYDROcodone-acetaminophen (NORCO/VICODIN) 5-325 MG tablet 1-2 tabs bid prn for severe headache that does not significantly improve with your migraine medication 30 tablet 0   Multiple Vitamin (MULTIVITAMIN WITH MINERALS) TABS Take 1 tablet by mouth daily.     valACYclovir (VALTREX) 1000 MG tablet 2 tabs po q12h x 2 doses for acute outbreak of sores on lips 20 tablet 2   baclofen (LIORESAL) 10 MG tablet Take 10 mg by mouth daily as needed. (Patient not taking: Reported on 05/28/2023)     Dimethyl Fumarate Starter Pack 120 & 240 MG MISC Take by mouth. (Patient not taking: Reported on 05/28/2023)     testosterone cypionate (DEPOTESTOSTERONE CYPIONATE) injection 200 mg      No facility-administered medications prior to visit.    No Known Allergies  Review of Systems As per HPI  PE:    06/16/2023    3:30 PM 05/28/2023    1:49 PM 05/10/2023    1:07 PM  Vitals with BMI  Height  6\' 2"    Weight 209 lbs 3 oz 207 lbs 10 oz   BMI 26.85 26.64   Systolic 127 136 865  Diastolic 86 89 84  Pulse 66 78      Physical Exam  VS:  noted--normal. Gen: alert, NAD, NONTOXIC APPEARING. HEENT: eyes without injection, drainage, or swelling.  Ears: EACs clear, TMs with normal light reflex and landmarks.  Nose: Clear rhinorrhea, with some dried, crusty exudate adherent to mildly injected mucosa.  No purulent d/c.  No paranasal sinus TTP.  No facial swelling.  Throat and mouth without focal lesion.  No pharyngial swelling, erythema, or exudate.   Neck: supple, no LAD.   LUNGS: CTA bilat, nonlabored resps.   CV: RRR, no m/r/g. EXT: no c/c/e SKIN: no rash   LABS:  None today  Lab Results  Component Value Date   TESTOSTERONE 117 (L) 05/28/2023   IMPRESSION AND PLAN:  #1 URI with bronchitis. Prolonged/lingering symptoms. Prednisone 40 mg a day x 5 days then 20 mg a day x 5 days. OTC cough/cold medication okay.  2.  Secondary male hypogonadism. Testosterone  injections have been cost prohibitive in the past. He wants to try a testosterone gel--AndroGel 1.62% pump prescribed, 2 pumps each thigh daily. Return to 1 month for recheck.  An After Visit Summary was printed and given to the patient.  FOLLOW UP: Return in about 4 weeks (around 07/14/2023) for f/u low testosterone.  Signed:  Santiago Bumpers, MD           06/16/2023

## 2023-06-18 ENCOUNTER — Telehealth: Payer: Self-pay

## 2023-06-21 ENCOUNTER — Telehealth: Payer: Self-pay

## 2023-06-21 ENCOUNTER — Other Ambulatory Visit (HOSPITAL_COMMUNITY): Payer: Self-pay

## 2023-06-21 NOTE — Telephone Encounter (Signed)
Pharmacy Patient Advocate Encounter   Received notification from Pt Calls Messages that prior authorization for Testosterone 20.25 MG/ACT(1.62%) gel is required/requested.   Insurance verification completed.   The patient is insured through [facility] .   Per test claim: PA required; PA submitted to [employer] via CoverMyMeds Key/confirmation #/EOC [id number] Status is pending

## 2023-06-21 NOTE — Telephone Encounter (Signed)
PA request has been Submitted. New Encounter created for follow up. For additional info see Pharmacy Prior Auth telephone encounter from 06/21/23.

## 2023-06-21 NOTE — Telephone Encounter (Signed)
Noted  

## 2023-06-23 NOTE — Telephone Encounter (Signed)
Pharmacy Patient Advocate Encounter  Received notification from [facility] that Prior Authorization for Testosterone 20.25 MG/ACT(1.62%) gel has been DENIED. Please advise how you'd like to proceed. Full denial letter will be uploaded to the media tab. See denial reason below.   PA #/Case ID/Reference #: [id number]   Denial Reason: This medication is covered for males with two low testosterone levels collected in the early morning. In this case, only one early morning low testosterone level was sent in for review. Another level sent in was not collected in the early morning.

## 2023-06-23 NOTE — Telephone Encounter (Signed)
Left pt a vm to return my call.

## 2023-06-23 NOTE — Telephone Encounter (Signed)
Please advise on alternative options

## 2023-06-23 NOTE — Telephone Encounter (Signed)
Please contact Dylan Pope and tell him his insurance company will not pay for any testosterone supplement because they require two low testosterone measurements in a row.  Please order total testosterone, diagnosis low testosterone.  Needs to be drawn as early in the morning as possible and he should fast 8 hours. Thanks

## 2023-06-24 ENCOUNTER — Encounter (INDEPENDENT_AMBULATORY_CARE_PROVIDER_SITE_OTHER): Payer: Self-pay

## 2023-06-30 DIAGNOSIS — Z1211 Encounter for screening for malignant neoplasm of colon: Secondary | ICD-10-CM | POA: Diagnosis not present

## 2023-07-01 DIAGNOSIS — G35 Multiple sclerosis: Secondary | ICD-10-CM | POA: Diagnosis not present

## 2023-07-08 ENCOUNTER — Encounter: Payer: Self-pay | Admitting: Family Medicine

## 2023-07-08 ENCOUNTER — Other Ambulatory Visit: Payer: Self-pay

## 2023-07-08 DIAGNOSIS — Z1211 Encounter for screening for malignant neoplasm of colon: Secondary | ICD-10-CM

## 2023-07-14 ENCOUNTER — Ambulatory Visit: Payer: BC Managed Care – PPO | Admitting: Family Medicine

## 2023-12-03 ENCOUNTER — Ambulatory Visit: Payer: BC Managed Care – PPO | Admitting: Family Medicine

## 2023-12-08 ENCOUNTER — Encounter: Payer: Self-pay | Admitting: Family Medicine

## 2023-12-08 ENCOUNTER — Ambulatory Visit (INDEPENDENT_AMBULATORY_CARE_PROVIDER_SITE_OTHER): Payer: BC Managed Care – PPO | Admitting: Family Medicine

## 2023-12-08 VITALS — BP 117/81 | HR 65 | Wt 224.2 lb

## 2023-12-08 DIAGNOSIS — R7989 Other specified abnormal findings of blood chemistry: Secondary | ICD-10-CM

## 2023-12-08 DIAGNOSIS — G43909 Migraine, unspecified, not intractable, without status migrainosus: Secondary | ICD-10-CM

## 2023-12-08 DIAGNOSIS — H1013 Acute atopic conjunctivitis, bilateral: Secondary | ICD-10-CM

## 2023-12-08 DIAGNOSIS — R195 Other fecal abnormalities: Secondary | ICD-10-CM | POA: Diagnosis not present

## 2023-12-08 DIAGNOSIS — Z23 Encounter for immunization: Secondary | ICD-10-CM | POA: Diagnosis not present

## 2023-12-08 NOTE — Patient Instructions (Signed)
Use over the counter zaditor eye drops as directed on the package.

## 2023-12-08 NOTE — Progress Notes (Signed)
OFFICE VISIT  12/08/2023  CC:  Chief Complaint  Patient presents with   Medical Management of Chronic Issues    Patient is a 48 y.o. 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 Injections too$. Recheck testost and LH and as long as testost still very low then try androgel.   2) Migraine syndrome: tylenol or NSAID is helpful many times but occ vicodin needed (rare)-->vicodin 5/325, 1-2 bid prn, #30.   3) Vit d def-->cont 1000 U every day. Check level today.   4) Vit B12 def-->normalized on oral supp-->cont 5000 mcg every day. Check b12 level today  INTERIM HX: Tried to start testosterone gel last visit but his insurer required two low testosterone levels in a row in order to consider covering testosterone replacement.  We never got him back in to recheck this. Still with significantly decreased libido.  Cologuard came back positive so we referred him to gastroenterology in August last year.  Somehow the referral did not get done.  For the last several months has felt like his eyes have been burning and intermittently bloodshot. Over-the-counter moisturizing drops have helped a little bit.  Minimal allergic rhinitis symptoms.  Headaches have not been much of an issue lately.  He rarely uses Vicodin. PMP AWARE reviewed today: most recent rx for Vicodin 5-325 was filled 06/16/2023, # 30, rx by me. No red flags.  ROS as above, plus--> no fevers, no CP, no SOB, no wheezing, no cough, no dizziness, no rashes, no melena/hematochezia.  No polyuria or polydipsia.  No myalgias or arthralgias.     Past Medical History:  Diagnosis Date   Cluster headaches 02/27/2010   Long history of HA's.  CT head neg 02/2013.  CT sinuses neg 2014.   Colon cancer screening    COLOGUARD POSITIVE 06/2023-->ref to GI   Environmental allergies    LOW BACK PAIN 08/15/2009   Migraine syndrome    Multiple sclerosis  (HCC)    relapsing-remitting->initial dx Dr. Everlena Cooper 10/2021.  2nd opinion Dr. Macarthur Critchley 11/2021.   OSA on CPAP    Dr. Kriste Basque   Restless legs syndrome    Sleep study 2017   Secondary male hypogonadism    TOBACCO USE, QUIT 08/15/2009    Past Surgical History:  Procedure Laterality Date   NASAL SINUS SURGERY  2014    Outpatient Medications Prior to Visit  Medication Sig Dispense Refill   acetaminophen (TYLENOL) 500 MG tablet Take 500 mg by mouth every 6 (six) hours as needed.     Cholecalciferol (VITAMIN D-3 PO) Take 1,000 Units by mouth daily.     Cyanocobalamin (VITAMIN B12 PO) Take 5,000 Units by mouth daily.     fluticasone (FLONASE) 50 MCG/ACT nasal spray Place 2 sprays into both nostrils daily.     HYDROcodone-acetaminophen (NORCO/VICODIN) 5-325 MG tablet 1-2 tabs bid prn for severe headache that does not significantly improve with your migraine medication 30 tablet 0   Multiple Vitamin (MULTIVITAMIN WITH MINERALS) TABS Take 1 tablet by mouth daily.     valACYclovir (VALTREX) 1000 MG tablet 2 tabs po q12h x 2 doses for acute outbreak of sores on lips 20 tablet 2   Testosterone 20.25 MG/ACT (1.62%) GEL 2 pumps each thigh once a day 75 g 5   baclofen (LIORESAL) 10 MG tablet Take 10 mg by mouth daily as needed. (Patient not taking: Reported on 12/08/2023)     Dimethyl Fumarate Starter Pack  120 & 240 MG MISC Take by mouth. (Patient not taking: Reported on 12/08/2023)     predniSONE (DELTASONE) 20 MG tablet 2 tabs po every day x 5d then 1 tab po every day x 5d 15 tablet 0   No facility-administered medications prior to visit.    No Known Allergies  Review of Systems As per HPI  PE:    12/08/2023    3:17 PM 06/16/2023    3:30 PM 05/28/2023    1:49 PM  Vitals with BMI  Height   6\' 2"   Weight 224 lbs 3 oz 209 lbs 3 oz 207 lbs 10 oz  BMI  26.85 26.64  Systolic 117 127 604  Diastolic 81 86 89  Pulse 65 66 78     Physical Exam  Gen: Alert, well appearing.  Patient is oriented to  person, place, time, and situation. AFFECT: pleasant, lucid thought and speech. Eyes: Periorbital swelling or erythema.  No palpebral swelling.  No significant bulbar or palpebral conjunctival injection.  No drainage.  Pupils are equal, red, and reactive.  LABS:  Last CBC Lab Results  Component Value Date   WBC 10.9 (H) 05/28/2023   HGB 15.0 05/28/2023   HCT 44.6 05/28/2023   MCV 91.4 05/28/2023   MCH 30.7 05/28/2023   RDW 13.5 05/28/2023   PLT 311 05/28/2023   Last metabolic panel Lab Results  Component Value Date   GLUCOSE 96 05/28/2023   NA 139 05/28/2023   K 5.0 05/28/2023   CL 101 05/28/2023   CO2 28 05/28/2023   BUN 10 05/28/2023   CREATININE 1.00 05/28/2023   GFR 94.66 11/27/2021   CALCIUM 10.0 05/28/2023   PROT 7.4 05/28/2023   ALBUMIN 4.4 11/27/2021   BILITOT 0.7 05/28/2023   ALKPHOS 88 11/27/2021   AST 15 05/28/2023   ALT 19 05/28/2023   Last lipids Lab Results  Component Value Date   CHOL 255 (H) 05/28/2023   HDL 69 05/28/2023   LDLCALC 152 (H) 05/28/2023   LDLDIRECT 107.7 08/02/2013   TRIG 196 (H) 05/28/2023   CHOLHDL 3.7 05/28/2023   Last thyroid functions Lab Results  Component Value Date   TSH 0.98 05/28/2023   T3TOTAL 73.1 (L) 03/30/2014   Last vitamin D Lab Results  Component Value Date   VD25OH 58 05/28/2023   Last vitamin B12 and Folate Lab Results  Component Value Date   VITAMINB12 426 05/28/2023   FOLATE >24.0 03/06/2022   Lab Results  Component Value Date   TESTOSTERONE 117 (L) 05/28/2023   IMPRESSION AND PLAN:  #1 secondary male hypogonadism. Recheck testosterone level today (non-fasting, 3:30 pm).  He will have to return for another in about a week. He prefers testosterone gel if we move forward with testosterone replacement. Will also check LH and prolactin.  #2 allergic conjunctivitis.  Dry eyes. Over-the-counter eye moisturizing drops. Recommended he start Zaditor over-the-counter as well.  #3 migraine syndrome:  tylenol or NSAID is helpful many times but occ vicodin needed (rare).  A new prescription was not needed today.  #4 positive Cologuard (06/30/23). Refer to GI.  An After Visit Summary was printed and given to the patient.  FOLLOW UP: Return in about 6 months (around 06/06/2024) for annual CPE (fasting).  Signed:  Santiago Bumpers, MD           12/08/2023

## 2023-12-09 DIAGNOSIS — R202 Paresthesia of skin: Secondary | ICD-10-CM | POA: Diagnosis not present

## 2023-12-09 DIAGNOSIS — G35 Multiple sclerosis: Secondary | ICD-10-CM | POA: Diagnosis not present

## 2023-12-09 LAB — TESTOSTERONE: Testosterone: 132.89 ng/dL — ABNORMAL LOW (ref 300.00–890.00)

## 2023-12-09 LAB — PROLACTIN: Prolactin: 5 ng/mL (ref 2.0–18.0)

## 2023-12-09 LAB — LUTEINIZING HORMONE: LH: 3.1 m[IU]/mL (ref 1.50–9.30)

## 2024-01-06 DIAGNOSIS — G35 Multiple sclerosis: Secondary | ICD-10-CM | POA: Diagnosis not present

## 2024-04-13 ENCOUNTER — Ambulatory Visit: Admitting: Family Medicine

## 2024-04-19 ENCOUNTER — Encounter: Payer: Self-pay | Admitting: Family Medicine

## 2024-04-19 ENCOUNTER — Ambulatory Visit (INDEPENDENT_AMBULATORY_CARE_PROVIDER_SITE_OTHER): Admitting: Family Medicine

## 2024-04-19 VITALS — BP 117/82 | HR 73 | Temp 98.0°F | Ht 74.0 in | Wt 212.4 lb

## 2024-04-19 DIAGNOSIS — M67951 Unspecified disorder of synovium and tendon, right thigh: Secondary | ICD-10-CM

## 2024-04-19 DIAGNOSIS — M533 Sacrococcygeal disorders, not elsewhere classified: Secondary | ICD-10-CM | POA: Diagnosis not present

## 2024-04-19 DIAGNOSIS — M545 Low back pain, unspecified: Secondary | ICD-10-CM | POA: Diagnosis not present

## 2024-04-19 NOTE — Progress Notes (Signed)
 OFFICE VISIT  04/19/2024  CC:  Chief Complaint  Patient presents with   Back Pain    Lower right side, x 3-4 weeks; radiating to R hip, back of leg, groin area; limited ROM and occasional back spasms. Used OTC Ibuprofen, capsicum back patches, Hydrocodone , Baclofen and Bengay topical cream    Patient is a 48 y.o. male who presents for low back pain.  HPI: Started getting right low back pain on 03/25/2024.  He had been doing quite a bit of more aggressive type yard work and then spent 2 days moving furniture for his church.  He then was doing quite a bit of activity in the home to get ready for vacation for a few days. The pain was localized in the right low back and somewhat in the right gluteal region on the lateral aspect and a bit in the groin. Denies radiating pain down the leg and denies paresthesias.  No leg weakness.  He has been doing heat application.  While he was on vacation for a week he had to take 400 to 600 mg of ibuprofen 3 times a day.  The pain has improved a bit over the last week.  PMP AWARE reviewed today: most recent rx for Vicodin was filled 06/16/2023, # 30, rx by me. No red flags.  Past Medical History:  Diagnosis Date   Cluster headaches 02/27/2010   Long history of HA's.  CT head neg 02/2013.  CT sinuses neg 2014.   Colon cancer screening    COLOGUARD POSITIVE 06/2023-->ref to GI   Environmental allergies    LOW BACK PAIN 08/15/2009   Migraine syndrome    Multiple sclerosis (HCC)    relapsing-remitting->initial dx Dr. Festus Hubert 10/2021.  2nd opinion Dr. Zeid 11/2021.   OSA on CPAP    Dr. Elinor Guardian   Restless legs syndrome    Sleep study 2017   Secondary male hypogonadism    TOBACCO USE, QUIT 08/15/2009    Past Surgical History:  Procedure Laterality Date   NASAL SINUS SURGERY  2014    Outpatient Medications Prior to Visit  Medication Sig Dispense Refill   baclofen (LIORESAL) 10 MG tablet Take 10 mg by mouth daily as needed.     Cholecalciferol (VITAMIN  D-3 PO) Take 1,000 Units by mouth daily.     Cyanocobalamin  (VITAMIN B12 PO) Take 5,000 Units by mouth daily.     fluticasone  (FLONASE) 50 MCG/ACT nasal spray Place 2 sprays into both nostrils daily.     HYDROcodone -acetaminophen  (NORCO/VICODIN) 5-325 MG tablet 1-2 tabs bid prn for severe headache that does not significantly improve with your migraine medication 30 tablet 0   Multiple Vitamin (MULTIVITAMIN WITH MINERALS) TABS Take 1 tablet by mouth daily.     acetaminophen  (TYLENOL ) 500 MG tablet Take 500 mg by mouth every 6 (six) hours as needed. (Patient not taking: Reported on 04/19/2024)     Dimethyl Fumarate Starter Pack 120 & 240 MG MISC Take by mouth. (Patient not taking: Reported on 05/28/2023)     valACYclovir  (VALTREX ) 1000 MG tablet 2 tabs po q12h x 2 doses for acute outbreak of sores on lips (Patient not taking: Reported on 04/19/2024) 20 tablet 2   No facility-administered medications prior to visit.    No Known Allergies  Review of Systems  As per HPI  PE:    04/19/2024   10:05 AM 12/08/2023    3:17 PM 06/16/2023    3:30 PM  Vitals with BMI  Height 6' 2  Weight 212 lbs 6 oz 224 lbs 3 oz 209 lbs 3 oz  BMI 27.26  26.85  Systolic 117 117 540  Diastolic 82 81 86  Pulse 73 65 66     Physical Exam  Gen: Alert, well appearing.  Patient is oriented to person, place, time, and situation. Inspection of the back shows a little bit of thoracic spine rotation deformity.  Left scapula appears to poke out a little more than the right. He has no low back or mid back tenderness to palpation.  He does have some tenderness in the right SI joint area as well as over the right greater trochanter.  Supine straight leg raise negative.FABER and  FADIR do not elicit any pain.  Pelvic distraction testing negative.  Lower extremity strength normal proximally and distally.  No sensory abnormalities.  LABS:  Last metabolic panel Lab Results  Component Value Date   GLUCOSE 96 05/28/2023   NA  139 05/28/2023   K 5.0 05/28/2023   CL 101 05/28/2023   CO2 28 05/28/2023   BUN 10 05/28/2023   CREATININE 1.00 05/28/2023   GFR 94.66 11/27/2021   CALCIUM 10.0 05/28/2023   PROT 7.4 05/28/2023   ALBUMIN 4.4 11/27/2021   BILITOT 0.7 05/28/2023   ALKPHOS 88 11/27/2021   AST 15 05/28/2023   ALT 19 05/28/2023   IMPRESSION AND PLAN:  Acute right low back pain and right gluteal tendon pain/tendinopathy.  He has some right SI joint pain as well. I do not think he is having sciatica from spinal nerve impingement. Will get him set up for physical therapy. He can use 400 to 600 mg of ibuprofen 1-2 times a day as needed. No imaging indicated at this time.  An After Visit Summary was printed and given to the patient.  FOLLOW UP: Return in about 6 weeks (around 05/31/2024) for Follow-up back pain.  Signed:  Arletha Lady, MD           04/19/2024

## 2024-04-25 ENCOUNTER — Encounter: Payer: Self-pay | Admitting: Family Medicine

## 2024-04-26 ENCOUNTER — Encounter: Payer: Self-pay | Admitting: Family Medicine

## 2024-04-26 MED ORDER — BACLOFEN 10 MG PO TABS: 10.0000 mg | ORAL_TABLET | Freq: Every day | ORAL | 0 refills | Status: AC | PRN

## 2024-04-26 NOTE — Telephone Encounter (Signed)
 Rx sent.

## 2024-04-27 MED ORDER — METAXALONE 800 MG PO TABS: ORAL_TABLET | ORAL | 1 refills | Status: AC

## 2024-04-27 NOTE — Telephone Encounter (Signed)
 Okay, Skelaxin prescription sent

## 2024-05-01 ENCOUNTER — Encounter: Payer: Self-pay | Admitting: Physical Therapy

## 2024-05-01 ENCOUNTER — Ambulatory Visit: Admitting: Physical Therapy

## 2024-05-01 DIAGNOSIS — M5459 Other low back pain: Secondary | ICD-10-CM | POA: Diagnosis not present

## 2024-05-01 NOTE — Therapy (Signed)
 OUTPATIENT PHYSICAL THERAPY LOWER EXTREMITY EVALUATION   Patient Name: Dylan Pope MRN: [id number] DOB:May 17, 1976, 48 y.o., male Today's Date: 05/01/2024  END OF SESSION:  PT End of Session - 05/01/24 1202     Visit Number 1    Number of Visits 16    Date for PT Re-Evaluation 06/26/24    Authorization Type BC[employer]- auth approved    PT Start Time 0804    PT Stop Time 0842    PT Time Calculation (min) 38 min    Activity Tolerance Patient tolerated treatment well    Behavior During Therapy [facility] for tasks assessed/performed          Past Medical History:  Diagnosis Date   Cluster headaches 02/27/2010   Long history of HA's.  CT head neg 02/2013.  CT sinuses neg 2014.   Colon cancer screening    COLOGUARD POSITIVE 06/2023-->ref to GI   Environmental allergies    LOW BACK PAIN 08/15/2009   Migraine syndrome    Multiple sclerosis (HCC)    relapsing-remitting->initial dx Dr. Skeet 10/2021.  2nd opinion Dr. Zeid 11/2021.   OSA on CPAP    Dr. 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/03/2014   Medial epicondylitis of left elbow 06/18/2014   Health maintenance examination 06/18/2014   Restless leg syndrome 03/12/2014   Migraine syndrome 03/12/2014   Other malaise and fatigue 03/12/2014   Well adult exam 08/02/2013   Headache 02/27/2010   SINUSITIS- ACUTE-NOS 12/30/2009   LOW BACK PAIN 08/15/2009   TOBACCO USE, QUIT 08/15/2009    PCP: Manus Hockey   REFERRING PROVIDER: Manus Hockey   REFERRING DIAG: Low back pain  THERAPY DIAG:  Other low back pain  Rationale for  Evaluation and Treatment: Rehabilitation  ONSET DATE: 03/25/24   SUBJECTIVE:   SUBJECTIVE STATEMENT: Pt states new onset of low back pain on 03/26/23 . He was doing a lot of increased activity at the time, lots of yard work, and getting ready to go away on vacation. Did lots of bending and moving, gardening, etc. States pain mostly in R side of low back, some into Lateral hip, into front of thigh.  Sitting/resting feels better than standing/walking. Now: pain slightly improved, but is sore on both sides.  Pain first thing in am.  Has tried Heating pad, has been resting. 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    PERTINENT HISTORY: MS (infusions every 6/mo), states mild fatigue but no other symptoms at this time,    PAIN:  Are you having pain? Yes: NPRS scale: 4/10 Pain location: Bil low back R> L.  Pain description: Sore, tight  Aggravating factors: standing, first thing in AM.  Relieving factors: none stated    PRECAUTIONS: None   WEIGHT BEARING RESTRICTIONS: No  [place]:  Has patient fallen in last 6 months? No   PLOF: Independent  PATIENT GOALS:  Decreased pain In back   NEXT MD VISIT:   OBJECTIVE:   DIAGNOSTIC FINDINGS: no imaging done  PATIENT SURVEYS:   COGNITION: Overall cognitive status: Within functional limits for tasks assessed     SENSATION: WFL  EDEMA:   POSTURE:    No Significant postural limitations  PALPATION:  R low lumbar, R SI, R glute med, Tender most of thoracic and lumbar spine with PA s.    LOWER EXTREMITY ROM: Lumbar: flexion: wfl,  extension: mod limitation/pain,  SB: WFL Hips: wfl Knees: wfl  LOWER EXTREMITY MMT:  MMT Left eval Right  eval  Hip flexion 4+ 4+  Hip extension    Hip abduction 4 4  Hip adduction    Hip internal rotation    Hip external rotation    Knee flexion 5 5  Knee extension 5 5  Ankle dorsiflexion    Ankle plantarflexion    Ankle inversion     Ankle eversion     (Blank rows = not tested)  LOWER EXTREMITY SPECIAL TESTS:  No pain with SLS,  Neg SLR,  Repeated flexion: comfortable. Repeated extension: pain.    GAIT: Unremarkable    TODAY'S TREATMENT:                                                                                                                              DATE:   05/01/2024 Therapeutic Exercise: see below for HEP Aerobic: Supine:  Seated: Standing: Stretches:  Neuromuscular Re-education: Manual Therapy: Therapeutic Activity: Self Care:    PATIENT EDUCATION:  Education details: PT POC, Exam findings, HEP Person educated: Patient Education method: Explanation, Demonstration, Tactile cues, Verbal cues, and Handouts Education comprehension: verbalized understanding, returned demonstration, verbal cues required, tactile cues required, and needs further education   HOME EXERCISE PROGRAM: Access Code: [email or web address] URL: [email or web address] Date: 05/01/2024 Prepared by: Tinnie Don  Exercises - Hooklying Single Knee to Chest  - 2 x daily - 3 reps - 20 hold - Supine Piriformis Stretch Pulling Heel to Hip  - 2 x daily - 3 reps - 20 hold - Supine Lower Trunk Rotation  - 2 x daily - 10 reps - 5 hold - Seated Figure 4 Piriformis Stretch  - 2 x daily - 3 reps - 20 hold   ASSESSMENT:  CLINICAL IMPRESSION: Patient presents with primary complaint of  pain in low back. He has tenderness in R low back, SI and glute. He has preference for flexion vs extension today. Pt with increased pain with extension, and lack of effective HEP for his ongoing pain.  Pt with decreased ability for full functional activities, standing, walkng, bending and IADLs. Pt will  benefit from skilled PT to improve deficits and pain and to return to PLOF.   OBJECTIVE IMPAIRMENTS: decreased activity tolerance, decreased mobility, decreased ROM, decreased strength, increased muscle spasms, improper body mechanics, and  pain.   ACTIVITY LIMITATIONS: lifting, bending, sitting, standing, squatting, transfers, and locomotion level  PARTICIPATION LIMITATIONS: cleaning, shopping, community activity, occupation, and yard work  PERSONAL FACTORS: Time since onset of injury/illness/exacerbation are also  affecting patient's functional outcome.   REHAB POTENTIAL: Good  CLINICAL DECISION MAKING: Stable/uncomplicated  EVALUATION COMPLEXITY: Low   GOALS: Goals reviewed with patient? Yes  SHORT TERM GOALS: Target date: 05/22/2024   Pt to be independent with initial HEP  Goal status: INITIAL   LONG TERM GOALS: Target date: 06/26/2024    Pt to be independent with final HEP  Goal status: INITIAL  2.  Pt to demo ability for lumbar rom into extension, without pain in back, to improve ability for IADLs.   Goal status: INITIAL  3.  Pt to report decreased pain in back to 0-2/10 with standing, walking, and work duties.   Goal status: INITIAL  4.  Pt to demo ability and optimal mechanics for bend, lift, squat, to improve ability for IADLs and work duties without pain.   Goal status: INITIAL  5.  Pt to show improved score on PSFS by at least 2 points.   Goal status: INITIAL     PLAN:  PT FREQUENCY: 1-2x/week  PT DURATION: 8 weeks  PLANNED INTERVENTIONS: Therapeutic exercises, Therapeutic activity, Neuromuscular re-education, Patient/Family education, Self Care, Joint mobilization, Joint manipulation, Stair training, Orthotic/Fit training, DME instructions, Aquatic Therapy, Dry Needling, Electrical stimulation, Cryotherapy, Moist heat, Taping, Ultrasound, Ionotophoresis 4mg /ml Dexamethasone, Manual therapy,  Vasopneumatic device, Traction, Spinal manipulation, Spinal mobilization,Balance training, Gait training,   PLAN FOR NEXT SESSION:    Tinnie Don, PT, DPT 2:59 PM  05/01/24

## 2024-05-09 ENCOUNTER — Ambulatory Visit: Admitting: Physical Therapy

## 2024-05-09 ENCOUNTER — Encounter: Payer: Self-pay | Admitting: Physical Therapy

## 2024-05-09 DIAGNOSIS — M6281 Muscle weakness (generalized): Secondary | ICD-10-CM | POA: Diagnosis not present

## 2024-05-09 DIAGNOSIS — M5459 Other low back pain: Secondary | ICD-10-CM | POA: Diagnosis not present

## 2024-05-09 NOTE — Therapy (Signed)
 OUTPATIENT PHYSICAL THERAPY LOWER EXTREMITY TREATMENT   Patient Name: Dylan Pope MRN: [id number] DOB:1976/09/06, 48 y.o., male Today's Date: 05/09/2024  END OF SESSION:  PT End of Session - 05/09/24 1258     Visit Number 2    Number of Visits 16    Date for PT Re-Evaluation 06/26/24    Authorization Type [employer]- auth approved    PT Start Time 1300    PT Stop Time 1340    PT Time Calculation (min) 40 min    Activity Tolerance Patient tolerated treatment well    Behavior During Therapy [facility] for tasks assessed/performed          Past Medical History:  Diagnosis Date   Cluster headaches 02/27/2010   Long history of HA's.  CT head neg 02/2013.  CT sinuses neg 2014.   Colon cancer screening    COLOGUARD POSITIVE 06/2023-->ref to GI   Environmental allergies    LOW BACK PAIN 08/15/2009   Migraine syndrome    Multiple sclerosis (HCC)    relapsing-remitting->initial dx Dr. Skeet 10/2021.  2nd opinion Dr. Zeid 11/2021.   OSA on CPAP    Dr. 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/03/2014   Medial epicondylitis of left elbow 06/18/2014   Health maintenance examination 06/18/2014   Restless leg syndrome 03/12/2014   Migraine syndrome 03/12/2014   Other malaise and fatigue 03/12/2014   Well adult exam 08/02/2013   Headache 02/27/2010   SINUSITIS- ACUTE-NOS 12/30/2009   LOW BACK PAIN 08/15/2009   TOBACCO USE, QUIT 08/15/2009    PCP: Manus Hockey   REFERRING PROVIDER: Manus Hockey   REFERRING DIAG: Low back pain  THERAPY DIAG:  Other low back pain  Muscle weakness  (generalized)  Rationale for Evaluation and Treatment: Rehabilitation  ONSET DATE: 03/25/24   SUBJECTIVE:   SUBJECTIVE STATEMENT: 05/09/2024 States he is still having some pain but it feels a little better.    EVAL: Pt states new onset of low back pain on 03/26/23 . He was doing a lot of increased activity at the time, lots of yard work, and getting ready to go away on vacation. Did lots of bending and moving, gardening, etc. States pain mostly in R side of low back, some into Lateral hip, into front of thigh.  Sitting/resting feels better than standing/walking. Now: pain slightly improved, but is sore on both sides.  Pain first thing in am.  Has tried Heating pad, has been resting. 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    PERTINENT HISTORY: MS (infusions every 6/mo), states mild fatigue but no other symptoms at this time,    PAIN:  Are you having pain? Yes: NPRS scale: 3/10 Pain location: Bil low back R> L.  Pain description: Sore, tight  Aggravating factors: standing, first thing in AM.  Relieving factors: none stated    PRECAUTIONS: None   WEIGHT BEARING RESTRICTIONS: No  [place]:  Has patient fallen in last 6 months? No   PLOF: Independent  PATIENT  GOALS:  Decreased pain In back   NEXT MD VISIT:   OBJECTIVE:   DIAGNOSTIC FINDINGS: no imaging done   PATIENT SURVEYS:   COGNITION: Overall cognitive status: Within functional limits for tasks assessed     SENSATION: WFL  EDEMA:   POSTURE:    No Significant postural limitations  PALPATION:  R low lumbar, R SI, R glute med, Tender most of thoracic and lumbar spine with PA s.    LOWER EXTREMITY ROM: Lumbar: flexion: wfl,  extension: mod limitation/pain,  SB: WFL Hips: wfl Knees: wfl  LOWER EXTREMITY MMT:  MMT Left eval Right  eval  Hip flexion 4+ 4+  Hip extension    Hip abduction 4 4  Hip adduction    Hip internal rotation    Hip external  rotation    Knee flexion 5 5  Knee extension 5 5  Ankle dorsiflexion    Ankle plantarflexion    Ankle inversion    Ankle eversion     (Blank rows = not tested)  LOWER EXTREMITY SPECIAL TESTS:  No pain with SLS,  Neg SLR,  Repeated flexion: comfortable. Repeated extension: pain.    GAIT: Unremarkable    TODAY'S TREATMENT:                                                                                                                              DATE:   05/09/2024 Therapeutic Exercise:  Aerobic: Supine: 90/90 relief with LTR then no ball LTR - 2 minutes, laying over towel roll at different levels of lumbar spine - tolerated well Prone: lying on stomach 2 minutes then POE 2 minutes, hamstring curls x15 5 holds B Seated: self traction in seat --> at counter --> Standing by door over head  Standing: Stretches:  Neuromuscular Re-education: lumbar support and reduced dump in seat in car  and how to adjust/what to look for in regards to pressure on lumbar spine. Manual Therapy: PA to lumbar spine grade II/III , STM to lumbar paraspinals and QL - tolerated, lumbar traction - felt good Therapeutic Activity: Self Care:    PATIENT EDUCATION:  Education details: PT POC, Exam findings, HEP Person educated: Patient Education method: Explanation, Demonstration, Tactile cues, Verbal cues, and Handouts Education comprehension: verbalized understanding, returned demonstration, verbal cues required, tactile cues required, and needs further education   HOME EXERCISE PROGRAM: Access Code: [email or web address] URL: [email or web address] Date: 05/01/2024 Prepared by: Tinnie Don  Exercises - Hooklying Single Knee to Chest  - 2 x daily - 3 reps - 20 hold - Supine Piriformis Stretch Pulling Heel to Hip  - 2 x daily - 3 reps - 20 hold - Supine Lower Trunk Rotation  - 2 x daily - 10 reps - 5 hold - Seated Figure 4 Piriformis Stretch  - 2 x daily - 3 reps - 20  hold   ASSESSMENT:  CLINICAL IMPRESSION: 05/09/2024 Trailed prone extension series and no significant change. Trailed traction and this  was tolerated well with reduced tension noted afterwards Educated patient in self traction techniques. Tolerated some of these exercises OK but mostly felt a stretch in his lats. Improved mobility noted end of session, will continue with current POC as tolerated.   EVAL: Patient presents with primary complaint of  pain in low back. He has tenderness in R low back, SI and glute. He has preference for flexion vs extension today. Pt with increased pain with extension, and lack of effective HEP for his ongoing pain.  Pt with decreased ability for full functional activities, standing, walkng, bending and IADLs. Pt will  benefit from skilled PT to improve deficits and pain and to return to PLOF.   OBJECTIVE IMPAIRMENTS: decreased activity tolerance, decreased mobility, decreased ROM, decreased strength, increased muscle spasms, improper body mechanics, and pain.   ACTIVITY LIMITATIONS: lifting, bending, sitting, standing, squatting, transfers, and locomotion level  PARTICIPATION LIMITATIONS: cleaning, shopping, community activity, occupation, and yard work  PERSONAL FACTORS: Time since onset of injury/illness/exacerbation are also affecting patient's functional outcome.   REHAB POTENTIAL: Good  CLINICAL DECISION MAKING: Stable/uncomplicated  EVALUATION COMPLEXITY: Low   GOALS: Goals reviewed with patient? Yes  SHORT TERM GOALS: Target date: 05/22/2024   Pt to be independent with initial HEP  Goal status: INITIAL   LONG TERM GOALS: Target date: 06/26/2024    Pt to be independent with final HEP  Goal status: INITIAL  2.  Pt to demo ability for lumbar rom into extension, without pain in back, to improve ability for IADLs.   Goal status: INITIAL  3.  Pt to report decreased pain in back to 0-2/10 with standing, walking, and work duties.   Goal  status: INITIAL  4.  Pt to demo ability and optimal mechanics for bend, lift, squat, to improve ability for IADLs and work duties without pain.   Goal status: INITIAL  5.  Pt to show improved score on PSFS by at least 2 points.   Goal status: INITIAL     PLAN:  PT FREQUENCY: 1-2x/week  PT DURATION: 8 weeks  PLANNED INTERVENTIONS: Therapeutic exercises, Therapeutic activity, Neuromuscular re-education, Patient/Family education, Self Care, Joint mobilization, Joint manipulation, Stair training, Orthotic/Fit training, DME instructions, Aquatic Therapy, Dry Needling, Electrical stimulation, Cryotherapy, Moist heat, Taping, Ultrasound, Ionotophoresis 4mg /ml Dexamethasone, Manual therapy,  Vasopneumatic device, Traction, Spinal manipulation, Spinal mobilization,Balance training, Gait training,   PLAN FOR NEXT SESSION:    2:30 PM, 05/09/24 Olivia Church, DPT Physical Therapy with [facility]

## 2024-05-15 ENCOUNTER — Ambulatory Visit: Admitting: Physical Therapy

## 2024-05-15 ENCOUNTER — Encounter: Payer: Self-pay | Admitting: Physical Therapy

## 2024-05-15 DIAGNOSIS — M6281 Muscle weakness (generalized): Secondary | ICD-10-CM

## 2024-05-15 DIAGNOSIS — M5459 Other low back pain: Secondary | ICD-10-CM | POA: Diagnosis not present

## 2024-05-15 NOTE — Therapy (Signed)
 OUTPATIENT PHYSICAL THERAPY LOWER EXTREMITY TREATMENT   Patient Name: Dylan Pope MRN: [id number] DOB:16-Oct-1976, 48 y.o., male Today's Date: 05/15/2024  END OF SESSION:  PT End of Session - 05/15/24 1345     Visit Number 3    Number of Visits 16    Date for PT Re-Evaluation 06/26/24    Authorization Type BCBS- auth approved    PT Start Time 1306    PT Stop Time 1345    PT Time Calculation (min) 39 min    Activity Tolerance Patient tolerated treatment well    Behavior During Therapy [facility] for tasks assessed/performed           Past Medical History:  Diagnosis Date   Cluster headaches 02/27/2010   Long history of HA's.  CT head neg 02/2013.  CT sinuses neg 2014.   Colon cancer screening    COLOGUARD POSITIVE 06/2023-->ref to GI   Environmental allergies    LOW BACK PAIN 08/15/2009   Migraine syndrome    Multiple sclerosis (HCC)    relapsing-remitting->initial dx Dr. Skeet 10/2021.  2nd opinion Dr. Zeid 11/2021.   OSA on CPAP    Dr. 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/03/2014   Medial epicondylitis of left elbow 06/18/2014   Health maintenance examination 06/18/2014   Restless leg syndrome 03/12/2014   Migraine syndrome 03/12/2014   Other malaise and fatigue 03/12/2014   Well adult exam 08/02/2013   Headache 02/27/2010   SINUSITIS- ACUTE-NOS 12/30/2009   LOW BACK PAIN 08/15/2009   TOBACCO USE, QUIT 08/15/2009    PCP: Manus Hockey   REFERRING PROVIDER: Manus Hockey   REFERRING DIAG: Low back pain  THERAPY DIAG:  Other low back pain  Muscle weakness  (generalized)  Rationale for Evaluation and Treatment: Rehabilitation  ONSET DATE: 03/25/24   SUBJECTIVE:   SUBJECTIVE STATEMENT: 05/15/2024 States he is still having some pain but it feels a little better. Most pain is in central low back, in L low lumbar, and R glute med.    EVAL: Pt states new onset of low back pain on 03/26/23 . He was doing a lot of increased activity at the time, lots of yard work, and getting ready to go away on vacation. Did lots of bending and moving, gardening, etc. States pain mostly in R side of low back, some into Lateral hip, into front of thigh.  Sitting/resting feels better than standing/walking. Now: pain slightly improved, but is sore on both sides.  Pain first thing in am.  Has tried Heating pad, has been resting. 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    PERTINENT HISTORY: MS (infusions every 6/mo), states mild fatigue but no other symptoms at this time,    PAIN:  Are you having pain? Yes: NPRS scale: 3/10 Pain location: Bil low back R> L.  Pain description: Sore, tight  Aggravating factors: standing, first thing in AM.  Relieving factors: none stated    PRECAUTIONS: None   WEIGHT BEARING RESTRICTIONS: No  [place]:  Has patient fallen in last 6 months? No   PLOF: Independent  PATIENT GOALS:  Decreased pain In back   NEXT MD VISIT:   OBJECTIVE:   DIAGNOSTIC FINDINGS: no imaging done   PATIENT SURVEYS:   COGNITION: Overall cognitive status: Within functional limits for tasks assessed     SENSATION: WFL  EDEMA:   POSTURE:    No Significant postural limitations  PALPATION:  R low lumbar, R SI, R glute med, Tender most of thoracic and lumbar spine with PA s.    LOWER EXTREMITY ROM: Lumbar: flexion: wfl,  extension: mod limitation/pain,  SB: WFL Hips: wfl Knees: wfl  LOWER EXTREMITY MMT:  MMT Left eval Right  eval  Hip flexion 4+ 4+  Hip extension    Hip abduction 4  4  Hip adduction    Hip internal rotation    Hip external rotation    Knee flexion 5 5  Knee extension 5 5  Ankle dorsiflexion    Ankle plantarflexion    Ankle inversion    Ankle eversion     (Blank rows = not tested)  LOWER EXTREMITY SPECIAL TESTS:  No pain with SLS,  Neg SLR,  Repeated flexion: comfortable. Repeated extension: pain.    GAIT: Unremarkable    TODAY'S TREATMENT:                                                                                                                              DATE:   05/15/2024 Therapeutic Exercise:  Aerobic: Supine:   LTR x 15;  HSS with strap/knee flex/ext x 10 bil;  SLR with TA x 10 bil ;    bridging x 10 ( mild soreness in R glute )  Prone:  Seated:  Standing: Stretches:  Neuromuscular Re-education: lumbar support and reduced dump in seat in car  and how to adjust/what to look for in regards to pressure on lumbar spine. Manual Therapy: LAD for lumbar bil; lumbar PA mobs,  STM tennis ball to R glute med. Therapeutic Activity: hip hinge motion x 10, sit to stand x 5, squat to mat table x5, squats with TA x 10;  Education and review of optimal mechanics for bending, squatting for job duties.  Self Care:    PATIENT EDUCATION:  Education details: Updated and Reviewed HEP  Person educated: Patient Education method: Explanation, Demonstration, Tactile cues, Verbal cues, and Handouts Education comprehension: verbalized understanding, returned demonstration, verbal cues required, tactile cues required, and needs further education   HOME EXERCISE PROGRAM: Access Code: [email or web address] URL: [email or web address] Date: 05/01/2024 Prepared by: Tinnie Don  Exercises - Hooklying Single Knee to Chest  - 2 x daily - 3 reps - 20 hold - Supine Piriformis Stretch Pulling Heel to Hip  - 2 x daily - 3 reps - 20 hold - Supine Lower Trunk Rotation  - 2 x daily - 10 reps - 5 hold - Seated Figure 4 Piriformis Stretch  -  2 x daily -  3 reps - 20 hold   ASSESSMENT:  CLINICAL IMPRESSION: 05/15/2024 Pt with mild soreness in central lumbar region with PA s, and soreness/tightness in R glute med. No pain with ther ex. Improved mechanics for hip hinge and squat after education today, but will benefit from continued work on this in future sessions for job duties. He has tendency for lordotic posture with bend/squat   EVAL: Patient presents with primary complaint of  pain in low back. He has tenderness in R low back, SI and glute. He has preference for flexion vs extension today. Pt with increased pain with extension, and lack of effective HEP for his ongoing pain.  Pt with decreased ability for full functional activities, standing, walkng, bending and IADLs. Pt will  benefit from skilled PT to improve deficits and pain and to return to PLOF.   OBJECTIVE IMPAIRMENTS: decreased activity tolerance, decreased mobility, decreased ROM, decreased strength, increased muscle spasms, improper body mechanics, and pain.   ACTIVITY LIMITATIONS: lifting, bending, sitting, standing, squatting, transfers, and locomotion level  PARTICIPATION LIMITATIONS: cleaning, shopping, community activity, occupation, and yard work  PERSONAL FACTORS: Time since onset of injury/illness/exacerbation are also affecting patient's functional outcome.   REHAB POTENTIAL: Good  CLINICAL DECISION MAKING: Stable/uncomplicated  EVALUATION COMPLEXITY: Low   GOALS: Goals reviewed with patient? Yes  SHORT TERM GOALS: Target date: 05/22/2024   Pt to be independent with initial HEP  Goal status: INITIAL   LONG TERM GOALS: Target date: 06/26/2024    Pt to be independent with final HEP  Goal status: INITIAL  2.  Pt to demo ability for lumbar rom into extension, without pain in back, to improve ability for IADLs.   Goal status: INITIAL  3.  Pt to report decreased pain in back to 0-2/10 with standing, walking, and work duties.   Goal status: INITIAL  4.   Pt to demo ability and optimal mechanics for bend, lift, squat, to improve ability for IADLs and work duties without pain.   Goal status: INITIAL  5.  Pt to show improved score on PSFS by at least 2 points.   Goal status: INITIAL     PLAN:  PT FREQUENCY: 1-2x/week  PT DURATION: 8 weeks  PLANNED INTERVENTIONS: Therapeutic exercises, Therapeutic activity, Neuromuscular re-education, Patient/Family education, Self Care, Joint mobilization, Joint manipulation, Stair training, Orthotic/Fit training, DME instructions, Aquatic Therapy, Dry Needling, Electrical stimulation, Cryotherapy, Moist heat, Taping, Ultrasound, Ionotophoresis 4mg /ml Dexamethasone, Manual therapy,  Vasopneumatic device, Traction, Spinal manipulation, Spinal mobilization,Balance training, Gait training,   PLAN FOR NEXT SESSION:    Tinnie Don, PT, DPT 1:46 PM  05/15/24

## 2024-05-19 ENCOUNTER — Other Ambulatory Visit: Payer: Self-pay | Admitting: Family Medicine

## 2024-05-23 ENCOUNTER — Other Ambulatory Visit: Payer: Self-pay | Admitting: Family Medicine

## 2024-05-24 ENCOUNTER — Encounter: Payer: Self-pay | Admitting: Physical Therapy

## 2024-05-24 ENCOUNTER — Ambulatory Visit (INDEPENDENT_AMBULATORY_CARE_PROVIDER_SITE_OTHER): Admitting: Physical Therapy

## 2024-05-24 DIAGNOSIS — M6281 Muscle weakness (generalized): Secondary | ICD-10-CM | POA: Diagnosis not present

## 2024-05-24 DIAGNOSIS — M5459 Other low back pain: Secondary | ICD-10-CM

## 2024-05-24 NOTE — Therapy (Signed)
 OUTPATIENT PHYSICAL THERAPY LOWER EXTREMITY TREATMENT   Patient Name: Dylan Pope MRN: [id number] DOB:04-Nov-1976, 48 y.o., male Today's Date: 05/24/2024  END OF SESSION:  PT End of Session - 05/24/24 1410     Visit Number 4    Number of Visits 16    Date for PT Re-Evaluation 06/26/24    Authorization Type [employer]- auth approved    PT Start Time 1348    PT Stop Time 1426    PT Time Calculation (min) 38 min    Activity Tolerance Patient tolerated treatment well    Behavior During Therapy [facility] for tasks assessed/performed            Past Medical History:  Diagnosis Date   Cluster headaches 02/27/2010   Long history of HA's.  CT head neg 02/2013.  CT sinuses neg 2014.   Colon cancer screening    COLOGUARD POSITIVE 06/2023-->ref to GI   Environmental allergies    LOW BACK PAIN 08/15/2009   Migraine syndrome    Multiple sclerosis (HCC)    relapsing-remitting->initial dx Dr. Skeet 10/2021.  2nd opinion Dr. Zeid 11/2021.   OSA on CPAP    Dr. 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/03/2014   Medial epicondylitis of left elbow 06/18/2014   Health maintenance examination 06/18/2014   Restless leg syndrome 03/12/2014   Migraine syndrome 03/12/2014   Other malaise and fatigue 03/12/2014   Well adult exam 08/02/2013   Headache 02/27/2010   SINUSITIS- ACUTE-NOS 12/30/2009   LOW BACK PAIN 08/15/2009   TOBACCO USE, QUIT 08/15/2009    PCP: Manus Hockey   REFERRING PROVIDER: Manus Hockey   REFERRING DIAG: Low back pain  THERAPY DIAG:  Other low back pain  Muscle weakness  (generalized)  Rationale for Evaluation and Treatment: Rehabilitation  ONSET DATE: 03/25/24   SUBJECTIVE:   SUBJECTIVE STATEMENT: 05/24/2024 States he is still having some pain but doing better. Pain less, now 2/10, mostly in center.    EVAL: Pt states new onset of low back pain on 03/26/23 . He was doing a lot of increased activity at the time, lots of yard work, and getting ready to go away on vacation. Did lots of bending and moving, gardening, etc. States pain mostly in R side of low back, some into Lateral hip, into front of thigh.  Sitting/resting feels better than standing/walking. Now: pain slightly improved, but is sore on both sides.  Pain first thing in am.  Has tried Heating pad, has been resting. 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    PERTINENT HISTORY: MS (infusions every 6/mo), states mild fatigue but no other symptoms at this time,    PAIN:  Are you having pain? Yes: NPRS scale: 3/10 Pain location: Bil low back R> L.  Pain description: Sore, tight  Aggravating factors: standing, first thing in AM.  Relieving factors: none stated    PRECAUTIONS: None   WEIGHT BEARING RESTRICTIONS: No  [place]:  Has patient fallen in last 6 months? No  PLOF: Independent  PATIENT GOALS:  Decreased pain In back   NEXT MD VISIT:   OBJECTIVE:   DIAGNOSTIC FINDINGS: no imaging done   PATIENT SURVEYS:   COGNITION: Overall cognitive status: Within functional limits for tasks assessed     SENSATION: WFL  EDEMA:   POSTURE:    No Significant postural limitations  PALPATION:  R low lumbar, R SI, R glute med, Tender most of thoracic and lumbar spine with PA s.    LOWER EXTREMITY ROM: Lumbar: flexion: wfl,  extension: mod limitation/pain,  SB: WFL Hips: wfl Knees: wfl  LOWER EXTREMITY MMT:  MMT Left eval Right  eval  Hip flexion 4+ 4+  Hip extension    Hip abduction 4 4  Hip adduction    Hip internal  rotation    Hip external rotation    Knee flexion 5 5  Knee extension 5 5  Ankle dorsiflexion    Ankle plantarflexion    Ankle inversion    Ankle eversion     (Blank rows = not tested)  LOWER EXTREMITY SPECIAL TESTS:  No pain with SLS,  Neg SLR,  Repeated flexion: comfortable. Repeated extension: pain.    GAIT: Unremarkable    TODAY'S TREATMENT:                                                                                                                              DATE:   05/24/2024 Therapeutic Exercise:  Aerobic: Supine:   LTR x 15;   piriformis 30 sec x 3 bil;    SLR with TA x 10 bil  ;   bridging  x 10 , then x 5;  S/L: hip abd x 10 bil;  Prone:  Seated:  Standing:  hip abd x 10 bil;  step up with knee drive, TA , hinge x 15 bil;  Stretches:  Neuromuscular Re-education:   Manual Therapy:  LAD for lumbar bil; Therapeutic Activity:  Self Care:    PATIENT EDUCATION:  Education details: Updated and Reviewed HEP  Person educated: Patient Education method: Explanation, Demonstration, Tactile cues, Verbal cues, and Handouts Education comprehension: verbalized understanding, returned demonstration, verbal cues required, tactile cues required, and needs further education   HOME EXERCISE PROGRAM: Access Code: [email or web address] URL: [email or web address] Date: 05/01/2024 Prepared by: Tinnie Don  Exercises - Hooklying Single Knee to Chest  - 2 x daily - 3 reps - 20 hold - Supine Piriformis Stretch Pulling Heel to Hip  - 2 x daily - 3 reps - 20 hold - Supine Lower Trunk Rotation  - 2 x daily - 10 reps - 5 hold - Seated Figure 4 Piriformis Stretch  - 2 x daily - 3 reps - 20 hold   ASSESSMENT:  CLINICAL IMPRESSION: 05/24/2024 Pt with reported improvements with pain levels. He will benefit from progressive core and hip strength. He has improved comfort and ability for bridging today. Is challenged with strength/stability.   EVAL: Patient presents with  primary complaint of  pain in low back. He has tenderness in R low back, SI and glute. He has preference for flexion vs extension today. Pt with increased pain with extension, and lack of effective HEP for his ongoing pain.  Pt with decreased ability for full functional activities, standing, walkng, bending and IADLs. Pt will  benefit from skilled PT to improve deficits and pain and to return to PLOF.   OBJECTIVE IMPAIRMENTS: decreased activity tolerance, decreased mobility, decreased ROM, decreased strength, increased muscle spasms, improper body mechanics, and pain.   ACTIVITY LIMITATIONS: lifting, bending, sitting, standing, squatting, transfers, and locomotion level  PARTICIPATION LIMITATIONS: cleaning, shopping, community activity, occupation, and yard work  PERSONAL FACTORS: Time since onset of injury/illness/exacerbation are also affecting patient's functional outcome.   REHAB POTENTIAL: Good  CLINICAL DECISION MAKING: Stable/uncomplicated  EVALUATION COMPLEXITY: Low   GOALS: Goals reviewed with patient? Yes  SHORT TERM GOALS: Target date: 05/22/2024   Pt to be independent with initial HEP  Goal status: INITIAL   LONG TERM GOALS: Target date: 06/26/2024   Pt to be independent with final HEP  Goal status: INITIAL  2.  Pt to demo ability for lumbar rom into extension, without pain in back, to improve ability for IADLs.   Goal status: INITIAL  3.  Pt to report decreased pain in back to 0-2/10 with standing, walking, and work duties.   Goal status: INITIAL  4.  Pt to demo ability and optimal mechanics for bend, lift, squat, to improve ability for IADLs and work duties without pain.   Goal status: INITIAL  5.  Pt to show improved score on PSFS by at least 2 points.   Goal status: INITIAL     PLAN:  PT FREQUENCY: 1-2x/week  PT DURATION: 8 weeks  PLANNED INTERVENTIONS: Therapeutic exercises, Therapeutic activity, Neuromuscular re-education, Patient/Family  education, Self Care, Joint mobilization, Joint manipulation, Stair training, Orthotic/Fit training, DME instructions, Aquatic Therapy, Dry Needling, Electrical stimulation, Cryotherapy, Moist heat, Taping, Ultrasound, Ionotophoresis 4mg /ml Dexamethasone, Manual therapy,  Vasopneumatic device, Traction, Spinal manipulation, Spinal mobilization,Balance training, Gait training,   PLAN FOR NEXT SESSION:  planks, heel taps, core, hip extension- standing.    Tinnie Don, PT, DPT 2:27 PM  05/24/24

## 2024-05-26 ENCOUNTER — Encounter: Payer: Self-pay | Admitting: Advanced Practice Midwife

## 2024-05-31 ENCOUNTER — Ambulatory Visit: Admitting: Family Medicine

## 2024-06-02 ENCOUNTER — Encounter: Admitting: Physical Therapy

## 2024-06-07 ENCOUNTER — Encounter: Payer: Self-pay | Admitting: Family Medicine

## 2024-06-07 ENCOUNTER — Ambulatory Visit (INDEPENDENT_AMBULATORY_CARE_PROVIDER_SITE_OTHER): Payer: BC Managed Care – PPO | Admitting: Family Medicine

## 2024-06-07 VITALS — BP 125/86 | HR 80 | Temp 98.6°F | Resp 14 | Ht 74.0 in | Wt 216.4 lb

## 2024-06-07 DIAGNOSIS — E559 Vitamin D deficiency, unspecified: Secondary | ICD-10-CM

## 2024-06-07 DIAGNOSIS — Z0000 Encounter for general adult medical examination without abnormal findings: Secondary | ICD-10-CM | POA: Diagnosis not present

## 2024-06-07 DIAGNOSIS — G43909 Migraine, unspecified, not intractable, without status migrainosus: Secondary | ICD-10-CM

## 2024-06-07 DIAGNOSIS — E538 Deficiency of other specified B group vitamins: Secondary | ICD-10-CM | POA: Diagnosis not present

## 2024-06-07 DIAGNOSIS — R195 Other fecal abnormalities: Secondary | ICD-10-CM

## 2024-06-07 DIAGNOSIS — Z79899 Other long term (current) drug therapy: Secondary | ICD-10-CM

## 2024-06-07 DIAGNOSIS — R7989 Other specified abnormal findings of blood chemistry: Secondary | ICD-10-CM | POA: Diagnosis not present

## 2024-06-07 LAB — CBC WITH DIFFERENTIAL/PLATELET
Basophils Absolute: 0 K/uL (ref 0.0–0.1)
Basophils Relative: 0.4 % (ref 0.0–3.0)
Eosinophils Absolute: 0.1 K/uL (ref 0.0–0.7)
Eosinophils Relative: 1.9 % (ref 0.0–5.0)
HCT: 45.9 % (ref 39.0–52.0)
Hemoglobin: 15.4 g/dL (ref 13.0–17.0)
Lymphocytes Relative: 32.1 % (ref 12.0–46.0)
Lymphs Abs: 2.3 K/uL (ref 0.7–4.0)
MCHC: 33.5 g/dL (ref 30.0–36.0)
MCV: 94.2 fl (ref 78.0–100.0)
Monocytes Absolute: 0.7 K/uL (ref 0.1–1.0)
Monocytes Relative: 9.9 % (ref 3.0–12.0)
Neutro Abs: 4 K/uL (ref 1.4–7.7)
Neutrophils Relative %: 55.7 % (ref 43.0–77.0)
Platelets: 294 K/uL (ref 150.0–400.0)
RBC: 4.88 Mil/uL (ref 4.22–5.81)
RDW: 13.5 % (ref 11.5–15.5)
WBC: 7.1 K/uL (ref 4.0–10.5)

## 2024-06-07 LAB — COMPREHENSIVE METABOLIC PANEL WITH GFR
ALT: 24 U/L (ref 0–53)
AST: 16 U/L (ref 0–37)
Albumin: 4.7 g/dL (ref 3.5–5.2)
Alkaline Phosphatase: 93 U/L (ref 39–117)
BUN: 15 mg/dL (ref 6–23)
CO2: 32 meq/L (ref 19–32)
Calcium: 10 mg/dL (ref 8.4–10.5)
Chloride: 99 meq/L (ref 96–112)
Creatinine, Ser: 1.1 mg/dL (ref 0.40–1.50)
GFR: 79.97 mL/min (ref >60.00)
Glucose, Bld: 91 mg/dL (ref 70–99)
Potassium: 4.3 meq/L (ref 3.5–5.1)
Sodium: 139 meq/L (ref 135–145)
Total Bilirubin: 0.5 mg/dL (ref 0.2–1.2)
Total Protein: 7.1 g/dL (ref 6.0–8.3)

## 2024-06-07 LAB — LIPID PANEL
Cholesterol: 275 mg/dL — ABNORMAL HIGH (ref 0–200)
HDL: 52.2 mg/dL (ref >39.00)
LDL Cholesterol: 150 mg/dL — ABNORMAL HIGH (ref 0–99)
NonHDL: 222.99
Total CHOL/HDL Ratio: 5
Triglycerides: 364 mg/dL — ABNORMAL HIGH (ref 0.0–149.0)
VLDL: 72.8 mg/dL — ABNORMAL HIGH (ref 0.0–40.0)

## 2024-06-07 LAB — LUTEINIZING HORMONE: LH: 3.97 m[IU]/mL (ref 1.50–9.30)

## 2024-06-07 LAB — TESTOSTERONE: Testosterone: 152.59 ng/dL — ABNORMAL LOW (ref 300.00–890.00)

## 2024-06-07 LAB — VITAMIN B12: Vitamin B-12: 763 pg/mL (ref 211–911)

## 2024-06-07 LAB — TSH: TSH: 1.77 u[IU]/mL (ref 0.35–5.50)

## 2024-06-07 LAB — VITAMIN D 25 HYDROXY (VIT D DEFICIENCY, FRACTURES): VITD: 62.86 ng/mL (ref 30.00–100.00)

## 2024-06-07 MED ORDER — HYDROCODONE-ACETAMINOPHEN 5-325 MG PO TABS: ORAL_TABLET | ORAL | 0 refills | Status: AC

## 2024-06-07 MED ORDER — HYDROCODONE-ACETAMINOPHEN 5-325 MG PO TABS: ORAL_TABLET | ORAL | 0 refills | Status: DC

## 2024-06-07 NOTE — Progress Notes (Signed)
Office Note 06/07/2024  CC: No chief complaint on file.  HPI:  Patient is a 48 y.o. male who is here for annual health maintenance exam and follow-up of migraine syndrome, vitamin D  deficiency, and vitamin B12 deficiency.  He is getting PT for a back strain, feels 95% improved. He had to use a few Vicodin to treat his pain.  He usually only uses his Vicodin as a last resort for headache. He uses it very rarely. PMP AWARE reviewed today: most recent rx for Vicodin 5/325 was filled 06/16/2023, # 30, rx by me. No red flags.  Past Medical History:  Diagnosis Date   Cluster headaches 02/27/2010   Long history of HA's.  CT head neg 02/2013.  CT sinuses neg 2014.   Colon cancer screening    COLOGUARD POSITIVE 06/2023-->ref to GI   Environmental allergies    LOW BACK PAIN 08/15/2009   Migraine syndrome    Multiple sclerosis (HCC)    relapsing-remitting->initial dx Dr. Skeet 10/2021.  2nd opinion Dr. Zeid 11/2021.   OSA on CPAP    Dr. Christi   Restless legs syndrome    Sleep study 2017   Secondary male hypogonadism    TOBACCO USE, QUIT 08/15/2009    Past Surgical History:  Procedure Laterality Date   NASAL SINUS SURGERY  2014    Family History  Problem Relation Age of Onset   Hypertension Other    Hyperlipidemia Other    Hypertension Father    Hyperlipidemia Father     Social History   Socioeconomic History   Marital status: Married    Spouse name: Not on file   Number of children: Not on file   Years of education: Not on file   Highest education level: Associate degree: occupational, Scientist, product/process development, or vocational program  Occupational History   Occupation: Personnel officer: [employer]  Tobacco Use   Smoking status: Former    Current packs/day: 0.00    Average packs/day: 0.5 packs/day for 12.0 years (6.0 ttl pk-yrs)    Types: Cigarettes    Start date: 09/25/1997    Quit date: 09/25/2009    Years since quitting: 14.7   Smokeless tobacco: Never   Substance and Sexual Activity   Alcohol use: Yes    Alcohol/week: 0.0 standard drinks of alcohol    Comment: occasional use   Drug use: No   Sexual activity: Yes  Other Topics Concern   Not on file  Social History Narrative   Married, no children.   Manager at [employer].   Regular exercise, yes.   5 pack-yr smoking hx; quit about 2005.  Alc: 2 beers a day, no wine or hard liquor.           Social Drivers of Health   Financial Resource Strain: Patient Declined (04/19/2024)   Overall Financial Resource Strain (CARDIA)    Difficulty of Paying Living Expenses: Patient declined  Food Insecurity: Patient Declined (04/19/2024)   Hunger Vital Sign    Worried About Running Out of Food in the Last Year: Patient declined    Ran Out of Food in the Last Year: Patient declined  Transportation Needs: Patient Declined (04/19/2024)   PRAPARE - Administrator, Civil Service (Medical): Patient declined    Lack of Transportation (Non-Medical): Patient declined  Physical Activity: Sufficiently Active (04/19/2024)   Exercise Vital Sign    Days of Exercise per Week: 5 days    Minutes of Exercise per  Session: 60 min  Stress: No Stress Concern Present (04/19/2024)   [employer] of Occupational Health - Occupational Stress Questionnaire    Feeling of Stress : Only a little  Social Connections: Unknown (04/19/2024)   Social Connection and Isolation Panel    Frequency of Communication with Friends and Family: Patient declined    Frequency of Social Gatherings with Friends and Family: Patient declined    Attends Religious Services: More than 4 times per year    Active Member of Clubs or Organizations: Yes    Attends Engineer, structural: More than 4 times per year    Marital Status: Married  Catering manager Violence: Not on file    Outpatient Medications Prior to Visit  Medication Sig Dispense Refill   baclofen  (LIORESAL ) 10 MG tablet Take 1 tablet (10 mg  total) by mouth daily as needed. 30 each 0   Cholecalciferol (VITAMIN D -3 PO) Take 1,000 Units by mouth daily.     Cyanocobalamin  (VITAMIN B12 PO) Take 5,000 Units by mouth daily.     fluticasone  (FLONASE) 50 MCG/ACT nasal spray Place 2 sprays into both nostrils daily.     metaxalone  (SKELAXIN ) 800 MG tablet 1 tab p.o. twice daily as needed 30 tablet 1   Multiple Vitamin (MULTIVITAMIN WITH MINERALS) TABS Take 1 tablet by mouth daily.     HYDROcodone -acetaminophen  (NORCO/VICODIN) 5-325 MG tablet 1-2 tabs bid prn for severe headache that does not significantly improve with your migraine medication 30 tablet 0   acetaminophen  (TYLENOL ) 500 MG tablet Take 500 mg by mouth every 6 (six) hours as needed. (Patient not taking: Reported on 04/19/2024)     Dimethyl Fumarate Starter Pack 120 & 240 MG MISC Take by mouth. (Patient not taking: Reported on 05/28/2023)     valACYclovir  (VALTREX ) 1000 MG tablet 2 tabs po q12h x 2 doses for acute outbreak of sores on lips (Patient not taking: Reported on 04/19/2024) 20 tablet 2   No facility-administered medications prior to visit.    No Known Allergies  Review of Systems  Constitutional:  Negative for appetite change, chills, fatigue and fever.  HENT:  Negative for congestion, dental problem, ear pain and sore throat.   Eyes:  Negative for discharge, redness and visual disturbance.  Respiratory:  Negative for cough, chest tightness, shortness of breath and wheezing.   Cardiovascular:  Negative for chest pain, palpitations and leg swelling.  Gastrointestinal:  Negative for abdominal pain, blood in stool, diarrhea, nausea and vomiting.  Genitourinary:  Negative for difficulty urinating, dysuria, flank pain, frequency, hematuria and urgency.  Musculoskeletal:  Negative for arthralgias, back pain, joint swelling, myalgias and neck stiffness.  Skin:  Negative for pallor and rash.  Neurological:  Negative for dizziness, speech difficulty, weakness and headaches.   Hematological:  Negative for adenopathy. Does not bruise/bleed easily.  Psychiatric/Behavioral:  Negative for confusion and sleep disturbance. The patient is not nervous/anxious.    PE;    06/07/2024    9:11 AM 04/19/2024   10:05 AM 12/08/2023    3:17 PM  Vitals with BMI  Height 6' 2 6' 2   Weight 216 lbs 6 oz 212 lbs 6 oz 224 lbs 3 oz  BMI 27.77 27.26   Systolic 125 117 882  Diastolic 86 82 81  Pulse 80 73 65   Gen: Alert, well appearing.  Patient is oriented to person, place, time, and situation. AFFECT: pleasant, lucid thought and speech. ENT: Ears: EACs clear, normal epithelium.  TMs with good light  reflex and landmarks bilaterally.  Eyes: no injection, icteris, swelling, or exudate.  EOMI, PERRLA. Nose: no drainage or turbinate edema/swelling.  No injection or focal lesion.  Mouth: lips without lesion/swelling.  Oral mucosa pink and moist.  Dentition intact and without obvious caries or gingival swelling.  Oropharynx without erythema, exudate, or swelling.  Neck: supple/nontender.  No LAD, mass, or TM.  Carotid pulses 2+ bilaterally, without bruits. CV: RRR, no m/r/g.   LUNGS: CTA bilat, nonlabored resps, good aeration in all lung fields. ABD: soft, NT, ND, BS normal.  No hepatospenomegaly or mass.  No bruits. EXT: no clubbing, cyanosis, or edema.  Musculoskeletal: no joint swelling, erythema, warmth, or tenderness.  ROM of all joints intact. Skin - no sores or suspicious lesions or rashes or color changes  Pertinent labs:  Lab Results  Component Value Date   TSH 0.98 05/28/2023   Lab Results  Component Value Date   WBC 10.9 (H) 05/28/2023   HGB 15.0 05/28/2023   HCT 44.6 05/28/2023   MCV 91.4 05/28/2023   PLT 311 05/28/2023   Lab Results  Component Value Date   CREATININE 1.00 05/28/2023   BUN 10 05/28/2023   NA 139 05/28/2023   K 5.0 05/28/2023   CL 101 05/28/2023   CO2 28 05/28/2023   Lab Results  Component Value Date   ALT 19 05/28/2023   AST 15  05/28/2023   ALKPHOS 88 11/27/2021   BILITOT 0.7 05/28/2023   Lab Results  Component Value Date   CHOL 255 (H) 05/28/2023   Lab Results  Component Value Date   HDL 69 05/28/2023   Lab Results  Component Value Date   LDLCALC 152 (H) 05/28/2023   Lab Results  Component Value Date   TRIG 196 (H) 05/28/2023   Lab Results  Component Value Date   CHOLHDL 3.7 05/28/2023  Last vitamin D  Lab Results  Component Value Date   VD25OH 58 05/28/2023   Lab Results  Component Value Date   VITAMINB12 426 05/28/2023   Lab Results  Component Value Date   TESTOSTERONE  132.89 (L) 12/08/2023   ASSESSMENT AND PLAN:   1) Health maintenance exam: Reviewed age and gender appropriate health maintenance issues (prudent diet, regular exercise, health risks of tobacco and excessive alcohol, use of seatbelts, fire alarms in home, use of sunscreen).  Also reviewed age and gender appropriate health screening as well as vaccine recommendations. Vaccines: Prevnar 20-->he'll check with his neurologist (pt gets ocrelizumab  infusions for MS). Labs: HP ordered, also vit D and vitamin B12 (history of vitamin D  and vitamin B12 deficiency). Prostate ca screening: average risk patient= as per latest guidelines, start screening at 10 yrs of age. Colon ca screening: Cologuard testing was + August 2024--> GI referral was done-->he didn't set this up so we'll refer again today.  #2 migraine syndrome. Rare need for Vicodin 5/325.  #30 sent today.  3.  Vitamin B12 deficiency. Currently on 5000 units vitamin B12 daily. Check level today.  4.  Vitamin D  deficiency.  Currently on 1000 units daily. Check level today.  5.  Secondary male hypogonadism. Testosterone  injections were inconvenient and costly. We did not get him approved for the gel. Will recheck testosterone  level today and probably make another attempt to get him approved for AndroGel .  An After Visit Summary was printed and given to the  patient.  FOLLOW UP:  Return in about 1 year (around 06/07/2025) for annual CPE (fasting).  Signed:  Phil Zylpha Poynor, MD  06/07/2024  

## 2024-06-07 NOTE — Patient Instructions (Signed)
 Health Maintenance, Male  Adopting a healthy lifestyle and getting preventive care are important in promoting health and wellness. Ask your health care provider about:  The right schedule for you to have regular tests and exams.  Things you can do on your own to prevent diseases and keep yourself healthy.  What should I know about diet, weight, and exercise?  Eat a healthy diet    Eat a diet that includes plenty of vegetables, fruits, low-fat dairy products, and lean protein.  Do not eat a lot of foods that are high in solid fats, added sugars, or sodium.  Maintain a healthy weight  Body mass index (BMI) is a measurement that can be used to identify possible weight problems. It estimates body fat based on height and weight. Your health care provider can help determine your BMI and help you achieve or maintain a healthy weight.  Get regular exercise  Get regular exercise. This is one of the most important things you can do for your health. Most adults should:  Exercise for at least 150 minutes each week. The exercise should increase your heart rate and make you sweat (moderate-intensity exercise).  Do strengthening exercises at least twice a week. This is in addition to the moderate-intensity exercise.  Spend less time sitting. Even light physical activity can be beneficial.  Watch cholesterol and blood lipids  Have your blood tested for lipids and cholesterol at 47 years of age, then have this test every 5 years.  You may need to have your cholesterol levels checked more often if:  Your lipid or cholesterol levels are high.  You are older than 47 years of age.  You are at high risk for heart disease.  What should I know about cancer screening?  Many types of cancers can be detected early and may often be prevented. Depending on your health history and family history, you may need to have cancer screening at various ages. This may include screening for:  Colorectal cancer.  Prostate cancer.  Skin cancer.  Lung  cancer.  What should I know about heart disease, diabetes, and high blood pressure?  Blood pressure and heart disease  High blood pressure causes heart disease and increases the risk of stroke. This is more likely to develop in people who have high blood pressure readings or are overweight.  Talk with your health care provider about your target blood pressure readings.  Have your blood pressure checked:  Every 3-5 years if you are 9-95 years of age.  Every year if you are 85 years old or older.  If you are between the ages of 29 and 29 and are a current or former smoker, ask your health care provider if you should have a one-time screening for abdominal aortic aneurysm (AAA).  Diabetes  Have regular diabetes screenings. This checks your fasting blood sugar level. Have the screening done:  Once every three years after age 23 if you are at a normal weight and have a low risk for diabetes.  More often and at a younger age if you are overweight or have a high risk for diabetes.  What should I know about preventing infection?  Hepatitis B  If you have a higher risk for hepatitis B, you should be screened for this virus. Talk with your health care provider to find out if you are at risk for hepatitis B infection.  Hepatitis C  Blood testing is recommended for:  Everyone born from [birth year] through 1964.  Anyone  with known risk factors for hepatitis C.  Sexually transmitted infections (STIs)  You should be screened each year for STIs, including gonorrhea and chlamydia, if:  You are sexually active and are younger than 47 years of age.  You are older than 47 years of age and your health care provider tells you that you are at risk for this type of infection.  Your sexual activity has changed since you were last screened, and you are at increased risk for chlamydia or gonorrhea. Ask your health care provider if you are at risk.  Ask your health care provider about whether you are at high risk for HIV. Your health care provider  may recommend a prescription medicine to help prevent HIV infection. If you choose to take medicine to prevent HIV, you should first get tested for HIV. You should then be tested every 3 months for as long as you are taking the medicine.  Follow these instructions at home:  Alcohol use  Do not drink alcohol if your health care provider tells you not to drink.  If you drink alcohol:  Limit how much you have to 0-2 drinks a day.  Know how much alcohol is in your drink. In the [place] one drink equals one 12 oz bottle of beer (355 mL), one 5 oz glass of wine (148 mL), or one 1 oz glass of hard liquor (44 mL).  Lifestyle  Do not use any products that contain nicotine or tobacco. These products include cigarettes, chewing tobacco, and vaping devices, such as e-cigarettes. If you need help quitting, ask your health care provider.  Do not use street drugs.  Do not share needles.  Ask your health care provider for help if you need support or information about quitting drugs.  General instructions  Schedule regular health, dental, and eye exams.  Stay current with your vaccines.  Tell your health care provider if:  You often feel depressed.  You have ever been abused or do not feel safe at home.  Summary  Adopting a healthy lifestyle and getting preventive care are important in promoting health and wellness.  Follow your health care provider's instructions about healthy diet, exercising, and getting tested or screened for diseases.  Follow your health care provider's instructions on monitoring your cholesterol and blood pressure.  This information is not intended to replace advice given to you by your health care provider. Make sure you discuss any questions you have with your health care provider.  Document Revised: 03/17/2021 Document Reviewed: 03/17/2021  Elsevier Patient Education  2024 [employer].

## 2024-06-08 ENCOUNTER — Telehealth: Payer: Self-pay

## 2024-06-08 ENCOUNTER — Ambulatory Visit: Payer: Self-pay | Admitting: Family Medicine

## 2024-06-08 ENCOUNTER — Other Ambulatory Visit (HOSPITAL_COMMUNITY): Payer: Self-pay

## 2024-06-08 ENCOUNTER — Encounter: Payer: Self-pay | Admitting: Physical Therapy

## 2024-06-08 ENCOUNTER — Ambulatory Visit: Admitting: Physical Therapy

## 2024-06-08 DIAGNOSIS — M5459 Other low back pain: Secondary | ICD-10-CM | POA: Diagnosis not present

## 2024-06-08 DIAGNOSIS — M6281 Muscle weakness (generalized): Secondary | ICD-10-CM

## 2024-06-08 MED ORDER — TESTOSTERONE 20.25 MG/ACT (1.62%) TD GEL: TRANSDERMAL | 5 refills | Status: AC

## 2024-06-08 NOTE — Telephone Encounter (Signed)
 Pharmacy Patient Advocate Encounter   Received notification from CoverMyMeds that prior authorization for AndroGel  is required/requested.   Insurance verification completed.   The patient is insured through [facility] .   Per test claim: PA required; PA submitted to above mentioned insurance via CoverMyMeds Key/confirmation #/EOC [id number] Status is pending

## 2024-06-08 NOTE — Therapy (Signed)
 OUTPATIENT PHYSICAL THERAPY LOWER EXTREMITY TREATMENT   Patient Name: Dylan Pope MRN: [id number] DOB:September 21, 1976, 48 y.o., male Today's Date: 06/08/2024  END OF SESSION:  PT End of Session - 06/08/24 1533     Visit Number 5    Number of Visits 16    Date for PT Re-Evaluation 06/26/24    Authorization Type BCBS- auth approved    PT Start Time 1437    PT Stop Time 1520    PT Time Calculation (min) 43 min    Activity Tolerance Patient tolerated treatment well    Behavior During Therapy [facility] for tasks assessed/performed             Past Medical History:  Diagnosis Date   Cluster headaches 02/27/2010   Long history of HA's.  CT head neg 02/2013.  CT sinuses neg 2014.   Colon cancer screening    COLOGUARD POSITIVE 06/2023-->ref to GI   Environmental allergies    LOW BACK PAIN 08/15/2009   Migraine syndrome    Multiple sclerosis (HCC)    relapsing-remitting->initial dx Dr. Skeet 10/2021.  2nd opinion Dr. Zeid 11/2021.   OSA on CPAP    Dr. 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/03/2014   Medial epicondylitis of left elbow 06/18/2014   Health maintenance examination 06/18/2014   Restless leg syndrome 03/12/2014   Migraine syndrome 03/12/2014   Other malaise and fatigue 03/12/2014   Well adult exam 08/02/2013   Headache 02/27/2010   SINUSITIS- ACUTE-NOS 12/30/2009   LOW BACK PAIN 08/15/2009   TOBACCO USE, QUIT 08/15/2009    PCP: Manus Hockey   REFERRING PROVIDER: Manus Hockey   REFERRING DIAG: Low back pain  THERAPY DIAG:  Other low back pain  Muscle weakness  (generalized)  Rationale for Evaluation and Treatment: Rehabilitation  ONSET DATE: 03/25/24   SUBJECTIVE:   SUBJECTIVE STATEMENT: 06/08/2024 States he is still having some pain but doing better. Pain less, now 2/10, mostly in R glute   EVAL: Pt states new onset of low back pain on 03/26/23 . He was doing a lot of increased activity at the time, lots of yard work, and getting ready to go away on vacation. Did lots of bending and moving, gardening, etc. States pain mostly in R side of low back, some into Lateral hip, into front of thigh.  Sitting/resting feels better than standing/walking. Now: pain slightly improved, but is sore on both sides.  Pain first thing in am.  Has tried Heating pad, has been resting. 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    PERTINENT HISTORY: MS (infusions every 6/mo), states mild fatigue but no other symptoms at this time,    PAIN:  Are you having pain? Yes: NPRS scale: 3/10 Pain location: Bil low back R> L.  Pain description: Sore, tight  Aggravating factors: standing, first thing in AM.  Relieving factors: none stated    PRECAUTIONS: None   WEIGHT BEARING RESTRICTIONS: No  [place]:  Has patient fallen in last 6 months?  No   PLOF: Independent  PATIENT GOALS:  Decreased pain In back   NEXT MD VISIT:   OBJECTIVE:   DIAGNOSTIC FINDINGS: no imaging done   PATIENT SURVEYS:   COGNITION: Overall cognitive status: Within functional limits for tasks assessed     SENSATION: WFL  EDEMA:   POSTURE:    No Significant postural limitations  PALPATION:  R low lumbar, R SI, R glute med, Tender most of thoracic and lumbar spine with PA s.    LOWER EXTREMITY ROM: Lumbar: flexion: wfl,  extension: mod limitation/pain,  SB: WFL Hips: wfl Knees: wfl  LOWER EXTREMITY MMT:  MMT Left eval Right  eval  Hip flexion 4+ 4+  Hip extension    Hip abduction 4 4  Hip adduction    Hip internal  rotation    Hip external rotation    Knee flexion 5 5  Knee extension 5 5  Ankle dorsiflexion    Ankle plantarflexion    Ankle inversion    Ankle eversion     (Blank rows = not tested)  LOWER EXTREMITY SPECIAL TESTS:  No pain with SLS,  Neg SLR,  Repeated flexion: comfortable. Repeated extension: pain.    GAIT: Unremarkable    TODAY'S TREATMENT:                                                                                                                              DATE:   06/08/2024 Therapeutic Exercise:  Aerobic: Supine:   LTR x 15;    bridging  x 15 S/L:  Prone:  bird dog/ LEs only x 15  Seated:  Standing:  hip abd x 10 bil;  Stretches:  Neuromuscular Re-education:   Manual Therapy:  DTM to R glute med  Therapeutic Activity:   step up 6 in x 10 bil, no UE support;  Squats no weight x 12;   20 lb x 15;    Deadlift motion no weight x 10, 20 lb x 10,  45 lb x 6 Education and practice for hip hinge and optimal form.  Self Care:   PATIENT EDUCATION:  Education details: Updated and Reviewed HEP  Person educated: Patient Education method: Explanation, Demonstration, Tactile cues, Verbal cues, and Handouts Education comprehension: verbalized understanding, returned demonstration, verbal cues required, tactile cues required, and needs further education   HOME EXERCISE PROGRAM: Access Code: [email or web address] URL: [email or web address] Date: 05/01/2024 Prepared by: Tinnie Don  Exercises - Hooklying Single Knee to Chest  - 2 x daily - 3 reps - 20 hold - Supine Piriformis Stretch Pulling Heel to Hip  - 2 x daily - 3 reps - 20 hold - Supine Lower Trunk Rotation  - 2 x daily - 10 reps - 5 hold - Seated Figure 4 Piriformis Stretch  - 2 x daily - 3 reps - 20 hold   ASSESSMENT:  CLINICAL IMPRESSION: 06/08/2024 Pt with reported improvements with pain levels. . Education and practice for squat  and deadlift motions today, as well as progressed core strength. Pt  with improved ability and form with hip hinge motions with cueing for core stability. He will benefit from continued progressive strengthening for improving stability and pain with lifting for job duties.   EVAL: Patient presents with primary complaint of  pain in low back. He has tenderness in R low back, SI and glute. He has preference for flexion vs extension today. Pt with increased pain with extension, and lack of effective HEP for his ongoing pain.  Pt with decreased ability for full functional activities, standing, walkng, bending and IADLs. Pt will  benefit from skilled PT to improve deficits and pain and to return to PLOF.   OBJECTIVE IMPAIRMENTS: decreased activity tolerance, decreased mobility, decreased ROM, decreased strength, increased muscle spasms, improper body mechanics, and pain.   ACTIVITY LIMITATIONS: lifting, bending, sitting, standing, squatting, transfers, and locomotion level  PARTICIPATION LIMITATIONS: cleaning, shopping, community activity, occupation, and yard work  PERSONAL FACTORS: Time since onset of injury/illness/exacerbation are also affecting patient's functional outcome.   REHAB POTENTIAL: Good  CLINICAL DECISION MAKING: Stable/uncomplicated  EVALUATION COMPLEXITY: Low   GOALS: Goals reviewed with patient? Yes  SHORT TERM GOALS: Target date: 05/22/2024   Pt to be independent with initial HEP  Goal status: MET   LONG TERM GOALS: Target date: 06/26/2024   Pt to be independent with final HEP  Goal status: In progress  2.  Pt to demo ability for lumbar rom into extension, without pain in back, to improve ability for IADLs.   Goal status: In progress  3.  Pt to report decreased pain in back to 0-2/10 with standing, walking, and work duties.   Goal status: In progress  4.  Pt to demo ability and optimal mechanics for bend, lift, squat, to improve ability for IADLs and work duties without pain.  Goal status: In progress  5.  Pt to show  improved score on PSFS by at least 2 points.  Goal status: In progress     PLAN:  PT FREQUENCY: 1-2x/week  PT DURATION: 8 weeks  PLANNED INTERVENTIONS: Therapeutic exercises, Therapeutic activity, Neuromuscular re-education, Patient/Family education, Self Care, Joint mobilization, Joint manipulation, Stair training, Orthotic/Fit training, DME instructions, Aquatic Therapy, Dry Needling, Electrical stimulation, Cryotherapy, Moist heat, Taping, Ultrasound, Ionotophoresis 4mg /ml Dexamethasone, Manual therapy,  Vasopneumatic device, Traction, Spinal manipulation, Spinal mobilization,Balance training, Gait training,   PLAN FOR NEXT SESSION:  planks, heel taps, core, hip extension- standing. Test lumbar extension, continue hip hinge, squat, deadlift motions, R glute pain    Tinnie Don, PT, DPT 3:35 PM  06/08/24

## 2024-06-09 ENCOUNTER — Other Ambulatory Visit (HOSPITAL_COMMUNITY): Payer: Self-pay

## 2024-06-09 NOTE — Telephone Encounter (Signed)
 Pharmacy Patient Advocate Encounter  Received notification from [facility] that Prior Authorization for Androgel  has been APPROVED from 06/08/24 to 06/08/25. Ran test claim, Copay is $67.49. This test claim was processed through [facility]- copay amounts may vary at other pharmacies due to pharmacy/plan contracts, or as the patient moves through the different stages of their insurance plan.   PA #/Case ID/Reference #: [id number]

## 2024-06-19 ENCOUNTER — Encounter: Payer: Self-pay | Admitting: Physical Therapy

## 2024-06-19 ENCOUNTER — Ambulatory Visit: Admitting: Physical Therapy

## 2024-06-19 DIAGNOSIS — M5459 Other low back pain: Secondary | ICD-10-CM | POA: Diagnosis not present

## 2024-06-19 DIAGNOSIS — M6281 Muscle weakness (generalized): Secondary | ICD-10-CM

## 2024-06-19 NOTE — Therapy (Signed)
 OUTPATIENT PHYSICAL THERAPY LOWER EXTREMITY TREATMENT   Patient Name: Dylan Pope MRN: [id number] DOB:1975-11-21, 48 y.o., male Today's Date: 06/19/2024  END OF SESSION:  PT End of Session - 06/19/24 1350     Visit Number 6    Number of Visits 16    Date for PT Re-Evaluation 06/26/24    Authorization Type BCBS- auth approved    PT Start Time 1350    PT Stop Time 1430    PT Time Calculation (min) 40 min    Activity Tolerance Patient tolerated treatment well    Behavior During Therapy [facility] for tasks assessed/performed             Past Medical History:  Diagnosis Date   Cluster headaches 02/27/2010   Long history of HA's.  CT head neg 02/2013.  CT sinuses neg 2014.   Colon cancer screening    COLOGUARD POSITIVE 06/2023-->ref to GI   Environmental allergies    LOW BACK PAIN 08/15/2009   Migraine syndrome    Multiple sclerosis (HCC)    relapsing-remitting->initial dx Dr. Skeet 10/2021.  2nd opinion Dr. Zeid 11/2021.   OSA on CPAP    Dr. 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/03/2014   Medial epicondylitis of left elbow 06/18/2014   Health maintenance examination 06/18/2014   Restless leg syndrome 03/12/2014   Migraine syndrome 03/12/2014   Other malaise and fatigue 03/12/2014   Well adult exam 08/02/2013   Headache 02/27/2010   SINUSITIS- ACUTE-NOS 12/30/2009   LOW BACK PAIN 08/15/2009   TOBACCO USE, QUIT 08/15/2009    PCP: Manus Hockey   REFERRING PROVIDER: Manus Hockey   REFERRING DIAG: Low back pain  THERAPY DIAG:  Other low back pain  Muscle weakness  (generalized)  Rationale for Evaluation and Treatment: Rehabilitation  ONSET DATE: 03/25/24   SUBJECTIVE:   SUBJECTIVE STATEMENT: 06/19/2024 States he is still having some pain in R glute , but doing better. Still feeling it daily.    EVAL: Pt states new onset of low back pain on 03/26/23 . He was doing a lot of increased activity at the time, lots of yard work, and getting ready to go away on vacation. Did lots of bending and moving, gardening, etc. States pain mostly in R side of low back, some into Lateral hip, into front of thigh.  Sitting/resting feels better than standing/walking. Now: pain slightly improved, but is sore on both sides.  Pain first thing in am.  Has tried Heating pad, has been resting. 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    PERTINENT HISTORY: MS (infusions every 6/mo), states mild fatigue but no other symptoms at this time,    PAIN:  Are you having pain? Yes: NPRS scale: 3/10 Pain location: Bil low back R> L.  Pain description: Sore, tight  Aggravating factors: standing, first thing in AM.  Relieving factors: none stated    PRECAUTIONS: None   WEIGHT BEARING RESTRICTIONS: No  [place]:  Has patient fallen in last 6  months? No   PLOF: Independent  PATIENT GOALS:  Decreased pain In back   NEXT MD VISIT:   OBJECTIVE:   DIAGNOSTIC FINDINGS: no imaging done   PATIENT SURVEYS:   COGNITION: Overall cognitive status: Within functional limits for tasks assessed     SENSATION: WFL  EDEMA:   POSTURE:    No Significant postural limitations  PALPATION:  R low lumbar, R SI, R glute med, Tender most of thoracic and lumbar spine with PA s.    LOWER EXTREMITY ROM: Lumbar: flexion: wfl,  extension: mod limitation/pain,  SB: WFL Hips: wfl Knees: wfl  LOWER EXTREMITY MMT:  MMT Left eval Right  eval  Hip flexion 4+ 4+  Hip extension    Hip abduction 4 4  Hip adduction    Hip internal  rotation    Hip external rotation    Knee flexion 5 5  Knee extension 5 5  Ankle dorsiflexion    Ankle plantarflexion    Ankle inversion    Ankle eversion     (Blank rows = not tested)  LOWER EXTREMITY SPECIAL TESTS:  No pain with SLS,  Neg SLR,  Repeated flexion: comfortable. Repeated extension: pain.    GAIT: Unremarkable    TODAY'S TREATMENT:                                                                                                                              DATE:   06/19/2024 Therapeutic Exercise:  Aerobic: Supine:   SKTC x 3 bil;  Fig 4 x 3 bil;    bridging  x 12 ;  trial SL bridge(sore)  90/90 heel taps 3 x 6;  S/L:  Prone:  bird dog/ LEs only x 15 ;   Planks 10 sec x 3 on toes, x 3 on knees Seated:  Standing:  Stretches:  Neuromuscular Re-education:   Manual Therapy:  DTM/tennis ball  to R glute med  Therapeutic Activity:    step up 6 in x 10 bil, no UE support; 15 lb unilateral hold  Squats no weight x 10;   20 lb x 15;    Deadlift motion 25 lb 2 x 10,   Education and practice for hip hinge and optimal form.  Self Care:   PATIENT EDUCATION:  Education details: Updated and Reviewed HEP  Person educated: Patient Education method: Explanation, Demonstration, Tactile cues, Verbal cues, and Handouts Education comprehension: verbalized understanding, returned demonstration, verbal cues required, tactile cues required, and needs further education   HOME EXERCISE PROGRAM: Access Code: [email or web address] URL: [email or web address] Date: 05/01/2024 Prepared by: Tinnie Don  Exercises - Hooklying Single Knee to Chest  - 2 x daily - 3 reps - 20 hold - Supine Piriformis Stretch Pulling Heel to Hip  - 2 x daily - 3 reps - 20 hold - Supine Lower Trunk Rotation  - 2 x daily - 10 reps - 5 hold - Seated Figure 4 Piriformis Stretch  -  2 x daily - 3 reps - 20 hold   ASSESSMENT:  CLINICAL IMPRESSION: 06/19/2024 Pt with reported improvements with pain  levels. . Much improved body mechanics with squat and deadlift today. He has mild soreness in R glute, but overall showing very good improvements. He is very challenged with core and planks today, will benefit from continued work on core strength/stability, as well as R glute pain.   EVAL: Patient presents with primary complaint of  pain in low back. He has tenderness in R low back, SI and glute. He has preference for flexion vs extension today. Pt with increased pain with extension, and lack of effective HEP for his ongoing pain.  Pt with decreased ability for full functional activities, standing, walkng, bending and IADLs. Pt will  benefit from skilled PT to improve deficits and pain and to return to PLOF.   OBJECTIVE IMPAIRMENTS: decreased activity tolerance, decreased mobility, decreased ROM, decreased strength, increased muscle spasms, improper body mechanics, and pain.   ACTIVITY LIMITATIONS: lifting, bending, sitting, standing, squatting, transfers, and locomotion level  PARTICIPATION LIMITATIONS: cleaning, shopping, community activity, occupation, and yard work  PERSONAL FACTORS: Time since onset of injury/illness/exacerbation are also affecting patient's functional outcome.   REHAB POTENTIAL: Good  CLINICAL DECISION MAKING: Stable/uncomplicated  EVALUATION COMPLEXITY: Low   GOALS: Goals reviewed with patient? Yes  SHORT TERM GOALS: Target date: 05/22/2024   Pt to be independent with initial HEP  Goal status: MET   LONG TERM GOALS: Target date: 06/26/2024   Pt to be independent with final HEP  Goal status: In progress  2.  Pt to demo ability for lumbar rom into extension, without pain in back, to improve ability for IADLs.   Goal status: In progress  3.  Pt to report decreased pain in back to 0-2/10 with standing, walking, and work duties.   Goal status: In progress  4.  Pt to demo ability and optimal mechanics for bend, lift, squat, to improve ability for IADLs and  work duties without pain.  Goal status: In progress  5.  Pt to show improved score on PSFS by at least 2 points.  Goal status: In progress     PLAN:  PT FREQUENCY: 1-2x/week  PT DURATION: 8 weeks  PLANNED INTERVENTIONS: Therapeutic exercises, Therapeutic activity, Neuromuscular re-education, Patient/Family education, Self Care, Joint mobilization, Joint manipulation, Stair training, Orthotic/Fit training, DME instructions, Aquatic Therapy, Dry Needling, Electrical stimulation, Cryotherapy, Moist heat, Taping, Ultrasound, Ionotophoresis 4mg /ml Dexamethasone, Manual therapy,  Vasopneumatic device, Traction, Spinal manipulation, Spinal mobilization,Balance training, Gait training,   PLAN FOR NEXT SESSION:  planks, heel taps, core, hip extension- standing. Test lumbar extension, continue hip hinge, squat, deadlift motions, R glute pain    Tinnie Don, PT, DPT 1:50 PM  06/19/24

## 2024-07-06 DIAGNOSIS — G35 Multiple sclerosis: Secondary | ICD-10-CM | POA: Diagnosis not present

## 2024-07-13 ENCOUNTER — Encounter: Admitting: Physical Therapy

## 2024-10-04 ENCOUNTER — Encounter: Payer: Self-pay | Admitting: Family Medicine

## 2024-10-04 MED ORDER — VALACYCLOVIR HCL 1 G PO TABS: ORAL_TABLET | ORAL | 2 refills | Status: AC

## 2024-10-04 NOTE — Telephone Encounter (Signed)
 Valtrex  prescription sent. Unfortunately, I do not have any further suggestions to decrease the frequency of the cold sores.

## 2025-06-07 ENCOUNTER — Encounter: Admitting: Family Medicine
